# Patient Record
Sex: Male | Born: 1940
Health system: Southern US, Community
[De-identification: ages and names within clinical notes are randomized; demographics above are authoritative.]

## PROBLEM LIST (undated history)

## (undated) DIAGNOSIS — F32A Depression, unspecified: Secondary | ICD-10-CM

## (undated) DIAGNOSIS — F0390 Unspecified dementia without behavioral disturbance: Secondary | ICD-10-CM

## (undated) DIAGNOSIS — G20 Parkinson's disease: Secondary | ICD-10-CM

## (undated) DIAGNOSIS — F329 Major depressive disorder, single episode, unspecified: Secondary | ICD-10-CM

## (undated) HISTORY — DX: Major depressive disorder, single episode, unspecified: F32.9

## (undated) HISTORY — DX: Parkinson's disease: G20

## (undated) HISTORY — DX: Depression, unspecified: F32.A

---

## 2015-07-10 ENCOUNTER — Ambulatory Visit (INDEPENDENT_AMBULATORY_CARE_PROVIDER_SITE_OTHER): Payer: Medicare Other | Admitting: Neurology

## 2015-07-10 ENCOUNTER — Encounter: Payer: Self-pay | Admitting: Neurology

## 2015-07-10 VITALS — BP 120/60 | HR 64 | Ht 67.0 in | Wt 146.0 lb

## 2015-07-10 DIAGNOSIS — F0280 Dementia in other diseases classified elsewhere without behavioral disturbance: Secondary | ICD-10-CM | POA: Diagnosis not present

## 2015-07-10 DIAGNOSIS — G20 Parkinson's disease: Secondary | ICD-10-CM

## 2015-07-10 MED ORDER — CARBIDOPA-LEVODOPA ER 50-200 MG PO TBCR: 1.0000 | EXTENDED_RELEASE_TABLET | Freq: Every day | ORAL | Status: DC

## 2015-07-10 NOTE — Progress Notes (Signed)
William Cummings was seen today in the movement disorders clinic for neurologic consultation at the request of William Cummings, [name], NP.  The consultation is for the evaluation of Parkinsons disease.    This patient is accompanied in the office by his sister who supplements the history.  Pt states that he was dx 1 year ago in [place] but sx's started about a year prior to that.  First sx was tremor in the R hand and now tremor in the R foot.  No fam hx of PD.  Started on carbidopa/levodopa 25/100 about a year ago in [place] and it helped.  Moved to [place] last Aug, but hasn't had neurologist since moved here.  Currently takes carbidopa/levodopa 25/100 after breakfast after breakfast at 9:30am/after lunch at 2pm/after dinner at 6:45 and bedtime 11:00pm.  He can tell when the medication wears off.  His sister reports that he lives in independent assisted living and they have noticed that he has too many pills left at the end of the month.  At one point, they realized he was only taking one levodopa per day, but they do think he is taking more than that currently.  Specific Symptoms:  Tremor: Yes.   Voice: hypophonic Sleep: sleeps well  Vivid Dreams:  No.  Acting out dreams:  Yes.   (hollars out at night) Wet Pillows: No. Postural symptoms:  Yes.    Falls?  Yes.  , but last was 6 months ago Bradykinesia symptoms: slow movements and difficulty getting out of a chair Loss of smell:  Yes.   Loss of taste:  No. Urinary Incontinence:  No. Difficulty Swallowing:  No. Handwriting, micrographia: Yes.   Trouble with ADL's:  No.  Trouble buttoning clothing: Yes.  ; trouble tying shoes as well Depression:  Yes.   Memory changes:  No. Hallucinations:  No.  visual distortions: No. N/V:  No. Lightheaded:  No.  Syncope: No. Diplopia:  No. Dyskinesia:  No.  Doesn't know if he has ever had neuroimaging.  PREVIOUS MEDICATIONS: Sinemet  ALLERGIES:  No Known Allergies  CURRENT MEDICATIONS:  Outpatient Encounter  Prescriptions as of 07/10/2015  Medication Sig  . carbidopa-levodopa (SINEMET CR) 50-200 MG tablet Take 1 tablet by mouth at bedtime.  . carbidopa-levodopa (SINEMET IR) 25-100 MG tablet Take 1 tablet by mouth 4 (four) times daily.  . sertraline (ZOLOFT) 100 MG tablet Take 200 mg by mouth daily.   No facility-administered encounter medications on file as of 07/10/2015.    PAST MEDICAL HISTORY:   Past Medical History  Diagnosis Date  . Parkinson's disease (HCC)   . Depression     PAST SURGICAL HISTORY:  No past surgical history on file.  SOCIAL HISTORY:   Social History   Social History  . Marital Status: Single    Spouse Name: N/A  . Number of Children: N/A  . Years of Education: N/A   Occupational History  . Retired     Administrator, [employer]   Social History Main Topics  . Smoking status: Never Smoker   . Smokeless tobacco: Not on file  . Alcohol Use: 0.0 oz/week    0 Standard drinks or equivalent per week     Comment: glass of wine once every couple months  . Drug Use: No  . Sexual Activity: Not on file   Other Topics Concern  . Not on file   Social History Narrative  . No narrative on file    FAMILY HISTORY:   Family Status  Relation  Status Death Age  . Mother Deceased     HTN  . Father Deceased     HTN  . Brother Alive     x 6  . Sister Deceased     x1    ROS:  A complete 10 system review of systems was obtained and was unremarkable apart from what is mentioned above.  PHYSICAL EXAMINATION:    VITALS:   Filed Vitals:   07/10/15 1514  BP: 120/60  Pulse: 64  Height:  (1.702 m)  Weight: 146 lb (66.225 kg)    GEN:  The patient appears stated age and is in NAD. HEENT:  Normocephalic, atraumatic.  The mucous membranes are moist. The superficial temporal arteries are without ropiness or tenderness. CV:  RRR Lungs:  CTAB Neck/HEME:  There are no carotid bruits bilaterally.  Neurological examination:  Orientation:  Montreal Cognitive Assessment   07/10/2015  Visuospatial/ Executive (0/5) 5  Naming (0/3) 3  Attention: Read list of digits (0/2) 2  Attention: Read list of letters (0/1) 1  Attention: Serial 7 subtraction starting at 100 (0/3) 3  Language: Repeat phrase (0/2) 2  Language : Fluency (0/1) 1  Abstraction (0/2) 2  Delayed Recall (0/5) 0  Orientation (0/6) 4  Total 23  Adjusted Score (based on education) 23   Cranial nerves: There is good facial symmetry. There is facial hypomimia.  Pupils are pinpoint and minimally reactive.  Funduscopic exam is attempted, but disc margins are not well visualized bilaterally.  Extraocular muscles are intact. The visual fields are full to confrontational testing. The speech is fluent and clear but hypophonic. Soft palate rises symmetrically and there is no tongue deviation. Hearing is intact to conversational tone. Sensation: Sensation is intact to light and pinprick throughout (facial, trunk, extremities). Vibration is decreased at the bilateral big toe. There is no extinction with double simultaneous stimulation. There is no sensory dermatomal level identified. Motor: Strength is 5/5 in the bilateral upper and lower extremities.   Shoulder shrug is equal and symmetric.  There is a right pronator drift Deep tendon reflexes: Deep tendon reflexes are 2-/4 at the bilateral biceps, triceps, brachioradialis, patella and 1/4 at the bilateral achilles. Plantar responses are downgoing bilaterally.  Movement examination: Tone: There is mild increased tone in the right upper and left lower extremity.  Tone elsewhere is normal. Abnormal movements: There is RUE and RLE resting tremor.   Coordination:  There is  decremation with RAM's, with any form of RAMS, including alternating supination and pronation of the forearm, hand opening and closing, finger taps, heel taps and toe taps. Gait and Station: The patient has difficulty arising out of a deep-seated chair without the use of the hands and despite  multiple attempts, is unable to do so.  He ends up pushing off of the chair, but once he gets up, he is able to walk with a straight back and slightly decreased arm swing.  He is mildly ataxic.  Stride length is just slightly decreased.  He has a negative pull test.  ASSESSMENT/PLAN:  1.  Idiopathic Parkinson's disease.  The patient has tremor, bradykinesia, rigidity and mild postural instability.  -We discussed the diagnosis as well as pathophysiology of the disease.  We discussed treatment options as well as prognostic indicators.  Patient education was provided.  -Greater than 50% of the 60 minute visit was spent in counseling answering questions and talking about what to expect now as well as in the future.  We talked  about medication options as well as potential future surgical options.  We talked about safety in the home.  -Change the timing of his levodopa so that he is taking 1 tablet at 7 AM/11 AM/3 PM/7 PM and we will add carbidopa/levodopa 50/200 at night.  I encouraged his sister to try and find a pharmacy that will filled a pillbox for him.  I think that this will be easier.  -I will sent home physical and speech therapy to the house.  The patient is somewhat concerned about cost and apparently his son takes care of his finances.  His son lives in [place] and I told him to ask the therapy company about cost when they call him.  -We discussed community resources in the area including patient support groups and community exercise programs for PD and pt education was provided to the patient. 2.  Memory loss  -Even though the patient does well on his MoCA, I suspect that he does have mild Parkinson's dementia and that he does well in testing because he is highly educated.  I am concerned that he will not be able to live alone in independent living without much longer.  I think he may need assistance with medications before long.  We may need to explore neuropsych testing, but I did not want to  address that on his very first visit with me, when I addressed multiple other issues. 3.  I will plan on seeing him back in the next 2-3 months, sooner should new neurologic issues arise.

## 2015-07-10 NOTE — Patient Instructions (Signed)
1. Change Carbidopa Levodopa 25/100 IR dosage to the following: 1 tablet at 7:00 am, 1 tablet at 11:00 am, 1 tablet at 3:00 pm, 1 tablet at 7:00 pm  2. Add Carbidopa Levodopa 50/200 CR 1 tablet at bedtime. RX sent to your pharmacy.   3. We have referred [facility] for [facility]. They will contact you directly to set up care. They can be reached at [phone].  4. You can use a stationary bike for exercise.   5. Follow up 3 months.

## 2015-07-11 ENCOUNTER — Telehealth: Payer: Self-pay | Admitting: Neurology

## 2015-07-11 NOTE — Telephone Encounter (Signed)
Referral to [facility] for [facility] PT/ST faxed to [phone] with confirmation received. They will contact patient.  

## 2015-07-31 ENCOUNTER — Telehealth: Payer: Self-pay | Admitting: Neurology

## 2015-07-31 NOTE — Telephone Encounter (Signed)
Received voicemail from SheridanBetsy at [facility] [phone] asking for PT for 2 weeks and order for OT eval. Called back and gave verbal okay. She is to call with any other questions.

## 2015-10-10 ENCOUNTER — Ambulatory Visit: Payer: Medicare Other | Admitting: Neurology

## 2015-10-13 ENCOUNTER — Ambulatory Visit: Payer: Medicare Other | Admitting: Neurology

## 2015-11-04 ENCOUNTER — Other Ambulatory Visit: Payer: Self-pay | Admitting: Neurology

## 2015-11-04 MED ORDER — CARBIDOPA-LEVODOPA ER 50-200 MG PO TBCR: 1.0000 | EXTENDED_RELEASE_TABLET | Freq: Every day | ORAL | Status: DC

## 2015-11-04 NOTE — Telephone Encounter (Signed)
Carbidopa Levodopa 50/200 refill requested. Per last office note- patient to remain on medication. Refill approved and sent to patient's pharmacy.   

## 2015-11-05 ENCOUNTER — Ambulatory Visit: Payer: Medicare Other | Admitting: Neurology

## 2015-12-08 NOTE — Progress Notes (Signed)
William Cummings was seen today in the movement disorders clinic for neurologic consultation at the request of Adria Dill, NP.  The consultation is for the evaluation of Parkinsons disease.    This patient is accompanied in the office by his sister who supplements the history.  Pt states that he was dx 1 year ago in [place] but sx's started about a year prior to that.  First sx was tremor in the R hand and now tremor in the R foot.  No fam hx of PD.  Started on carbidopa/levodopa 25/100 about a year ago in [place] and it helped.  Moved to [place] last Aug, but hasn't had neurologist since moved here.  Currently takes carbidopa/levodopa 25/100 after breakfast after breakfast at 9:30am/after lunch at 2pm/after dinner at 6:45 and bedtime 11:00pm.  He can tell when the medication wears off.  His sister reports that he lives in independent assisted living and they have noticed that he has too many pills left at the end of the month.  At one point, they realized he was only taking one levodopa per day, but they do think he is taking more than that currently.  12/09/15 update:  The patient follows up today.  I have not seen him since his March visit, as he canceled his June visit at the same day as the visit.  He is supposed to be on carbidopa/levodopa 25/100, one tablet at 7 AM/11 AM/3 PM/7 PM and last visit I added carbidopa/levodopa 50/200 at bedtime; however he states that when he added the bedtime CR he dropped one tablet of the IR so he is only taking three of those throughout the day.   Sister came in part way through the visit and states that he just couldn't remember to take it four times a day.   Lives at [facility] but he cannot afford for them to do meds.  They do meals for him.    I also referred him for home physical therapies.   He cannot remember how long he did this.  He doesn't drive.     No cramping of the feet and toes at night.    Wearing off:  Yes.    How long before next dose:  n/a Falls:    No. N/V:  No. Hallucinations:  No.  visual distortions: No. Lightheaded:  No.  Syncope: No. Dyskinesia:  No.    PREVIOUS MEDICATIONS: Sinemet  ALLERGIES:  No Known Allergies  CURRENT MEDICATIONS:  Outpatient Encounter Prescriptions as of 12/09/2015  Medication Sig  . carbidopa-levodopa (SINEMET CR) 50-200 MG tablet Take 1 tablet by mouth at bedtime.  . carbidopa-levodopa (SINEMET IR) 25-100 MG tablet 2 in AM, 1 in afternoon and evening  . sertraline (ZOLOFT) 100 MG tablet Take 200 mg by mouth daily.  . [DISCONTINUED] carbidopa-levodopa (SINEMET CR) 50-200 MG tablet Take 1 tablet by mouth at bedtime.  . [DISCONTINUED] carbidopa-levodopa (SINEMET IR) 25-100 MG tablet Take 1 tablet by mouth 3 (three) times daily.    No facility-administered encounter medications on file as of 12/09/2015.     PAST MEDICAL HISTORY:   Past Medical History:  Diagnosis Date  . Depression   . Parkinson's disease (HCC)     PAST SURGICAL HISTORY:  No past surgical history on file.  SOCIAL HISTORY:   Social History   Social History  . Marital status: Single    Spouse name: N/A  . Number of children: N/A  . Years of education: N/A  Occupational History  . Retired     Administrator, [employer]   Social History Main Topics  . Smoking status: Never Smoker  . Smokeless tobacco: Not on file  . Alcohol use 0.0 oz/week     Comment: glass of wine once every couple months  . Drug use: No  . Sexual activity: Not on file   Other Topics Concern  . Not on file   Social History Narrative  . No narrative on file    FAMILY HISTORY:   Family Status  Relation Status  . Mother Deceased   HTN  . Father Deceased   HTN  . Brother Alive   x 6  . Sister Deceased   x1    ROS:  A complete 10 system review of systems was obtained and was unremarkable apart from what is mentioned above.  PHYSICAL EXAMINATION:    VITALS:   Vitals:   12/09/15 1458  BP: 126/68  Pulse: 82  Weight: 149 lb (67.6 kg)  Height: 5\' 7"   (1.702 m)    GEN:  The patient appears stated age and is in NAD. HEENT:  Normocephalic, atraumatic.  The mucous membranes are moist. The superficial temporal arteries are without ropiness or tenderness. CV:  RRR Lungs:  CTAB Neck/HEME:  There are no carotid bruits bilaterally.  Neurological examination:  Orientation:  Montreal Cognitive Assessment  07/10/2015  Visuospatial/ Executive (0/5) 5  Naming (0/3) 3  Attention: Read list of digits (0/2) 2  Attention: Read list of letters (0/1) 1  Attention: Serial 7 subtraction starting at 100 (0/3) 3  Language: Repeat phrase (0/2) 2  Language : Fluency (0/1) 1  Abstraction (0/2) 2  Delayed Recall (0/5) 0  Orientation (0/6) 4  Total 23  Adjusted Score (based on education) 23   Cranial nerves: There is good facial symmetry. There is facial hypomimia.  Pupils are pinpoint and minimally reactive.  Funduscopic exam is attempted, but disc margins are not well visualized bilaterally.  Extraocular muscles are intact. The visual fields are full to confrontational testing. The speech is fluent and clear but hypophonic. Soft palate rises symmetrically and there is no tongue deviation. Hearing is intact to conversational tone. Sensation: Sensation is intact to light touch throughout Motor: Strength is 5/5 in the bilateral upper and lower extremities.   Shoulder shrug is equal and symmetric.  There is a right pronator drift   Movement examination: Tone: There is mild increased tone in the right upper and left lower extremity.  Tone elsewhere is normal. Abnormal movements: There is RUE and RLE resting tremor that is moderate in nature but intermittent.   Coordination:  There is  decremation with RAM's, with any form of RAMS, including alternating supination and pronation of the forearm, hand opening and closing, finger taps, heel taps and toe taps. Gait and Station: The patient has difficulty arising out of a deep-seated chair without the use of the  hands and despite multiple attempts, is unable to do so.  He ends up pushing off of the chair, but once he gets up, he is able to walk with a straight back and slightly decreased arm swing.  He is mildly ataxic.  Stride length is just slightly decreased.  He has a negative pull test.  ASSESSMENT/PLAN:  1.  Idiopathic Parkinson's disease.  The patient has tremor, bradykinesia, rigidity and mild postural instability.  -Cannot take the daytime dose of levodopa more than 3 times a day, primarily because he cannot remember it and his sister  has to call him and remind him to take medication.  [facility], but cannot afford the medication distribution part.  Therefore, we opted to have him take carbidopa/levodopa 25/100, 2 tablets in the morning, one in the afternoon, one in the evening and then continue his carbidopa/levodopa 50/200 at night.  -I encouraged the patient to [facility] stationary [facility].  I talked to the patient and his sister about the rock steady boxing program, if he is able to get there.  He does not drive.  -We discussed community resources in the area including patient support groups and community exercise programs for PD and pt education was provided to the patient. 2.  Parkinson's dementia  -Even though the patient does well on his MoCA, I suspect that he does have Parkinson's dementia and that he does well in testing because he is highly educated.  I am concerned that he will not be able to live alone in independent living without much longer.  However, cost is a big issue for them, and they do seem to be managing okay right the second.  We talked about ways to manage medication.  They cannot afford a pillbox with an alarm on it, but his sister is starting to call him more faithfully and we did rework his medications to help that today. 3.  I will plan on seeing him back in the next 4-5 months, sooner should new neurologic issues arise.  Much greater  than 50% of this visit was spent in counseling discussing issues of home safety.  Total face to face time:  30 min

## 2015-12-09 ENCOUNTER — Encounter: Payer: Self-pay | Admitting: Neurology

## 2015-12-09 ENCOUNTER — Ambulatory Visit (INDEPENDENT_AMBULATORY_CARE_PROVIDER_SITE_OTHER): Payer: Medicare Other | Admitting: Neurology

## 2015-12-09 VITALS — BP 126/68 | HR 82 | Ht 67.0 in | Wt 149.0 lb

## 2015-12-09 DIAGNOSIS — G20 Parkinson's disease: Secondary | ICD-10-CM | POA: Diagnosis not present

## 2015-12-09 DIAGNOSIS — G20A1 Parkinson's disease without dyskinesia, without mention of fluctuations: Secondary | ICD-10-CM

## 2015-12-09 DIAGNOSIS — F0280 Dementia in other diseases classified elsewhere without behavioral disturbance: Secondary | ICD-10-CM | POA: Diagnosis not present

## 2015-12-09 MED ORDER — CARBIDOPA-LEVODOPA ER 50-200 MG PO TBCR: 1.0000 | EXTENDED_RELEASE_TABLET | Freq: Every day | ORAL | 5 refills | Status: DC

## 2015-12-09 MED ORDER — CARBIDOPA-LEVODOPA 25-100 MG PO TABS: ORAL_TABLET | ORAL | 5 refills | Status: DC

## 2015-12-09 NOTE — Patient Instructions (Addendum)
1.  Start taking 2 tablets of carbidopa/levodopa 25/100 in the AM at 7am, and continue taking 1 tablet about 11-12 and 1 tablet around 4-5 pm 2.  Continue taking carbidopa/levodopa 50/200 at bedtime 3.  Ride the stationary bike

## 2016-05-11 ENCOUNTER — Ambulatory Visit: Payer: Medicare Other | Admitting: Neurology

## 2016-05-31 ENCOUNTER — Telehealth: Payer: Self-pay | Admitting: Neurology

## 2016-06-05 ENCOUNTER — Other Ambulatory Visit: Payer: Self-pay | Admitting: Neurology

## 2016-06-16 NOTE — Telephone Encounter (Signed)
Made in error

## 2016-06-16 NOTE — Progress Notes (Signed)
William Cummings was seen today in the movement disorders clinic for neurologic consultation at the request of [name], [name], NP.  The consultation is for the evaluation of Parkinsons disease.    This patient is accompanied in the office by his sister who supplements the history.  Pt states that he was dx 1 year ago in [place] but sx's started about a year prior to that.  First sx was tremor in the R hand and now tremor in the R foot.  No fam hx of PD.  Started on carbidopa/levodopa 25/100 about a year ago in [place] and it helped.  Moved to [place] last Aug, but hasn't had neurologist since moved here.  Currently takes carbidopa/levodopa 25/100 after breakfast after breakfast at 9:30am/after lunch at 2pm/after dinner at 6:45 and bedtime 11:00pm.  He can tell when the medication wears off.  His sister reports that he lives in independent assisted living and they have noticed that he has too many pills left at the end of the month.  At one point, they realized he was only taking one levodopa per day, but they do think he is taking more than that currently.  12/09/15 update:  The patient follows up today.  I have not seen him since his March visit, as he canceled his June visit at the same day as the visit.  He is supposed to be on carbidopa/levodopa 25/100, one tablet at 7 AM/11 AM/3 PM/7 PM and last visit I added carbidopa/levodopa 50/200 at bedtime; however he states that when he added the bedtime CR he dropped one tablet of the IR so he is only taking three of those throughout the day.   Sister came in part way through the visit and states that he just couldn't remember to take it four times a day.   Lives at [facility] but he cannot afford for them to do meds.  They do meals for him.    I also referred him for home physical therapies.   He cannot remember how long he did this.  He doesn't drive.     No cramping of the feet and toes at night.    Wearing off:  Yes.    How long before next dose:  n/a Falls:    No. N/V:  No. Hallucinations:  No.  visual distortions: No. Lightheaded:  No.  Syncope: No. Dyskinesia:  No.   06/17/16 update:  Patient follows up today.  His sister is apparently here but she doesn't come back to the room today.   I have not seen him in about 6 months.  He is prescribed carbidopa/levodopa 25/100, 2 tablets in the morning, one in the afternoon and one in the evening in addition to carbidopa/levodopa 50/200 at bedtime.  He states that he is doing pretty well.  No falls.  Not exercising.  Memory has been fair - "I still remember where my room and the dining room is."  No visual distortions or hallucinations.  No lightheadedness.    PREVIOUS MEDICATIONS: Sinemet  ALLERGIES:  No Known Allergies  CURRENT MEDICATIONS:  Outpatient Encounter Prescriptions as of 06/17/2016  Medication Sig  . carbidopa-levodopa (SINEMET CR) 50-200 MG tablet take 1 tablet by mouth at bedtime  . carbidopa-levodopa (SINEMET IR) 25-100 MG tablet 2 in AM, 1 in afternoon and evening  . sertraline (ZOLOFT) 100 MG tablet Take 200 mg by mouth daily.   No facility-administered encounter medications on file as of 06/17/2016.     PAST MEDICAL  HISTORY:   Past Medical History:  Diagnosis Date  . Depression   . Parkinson's disease (HCC)     PAST SURGICAL HISTORY:  History reviewed. No pertinent surgical history.  SOCIAL HISTORY:   Social History   Social History  . Marital status: Single    Spouse name: N/A  . Number of children: N/A  . Years of education: N/A   Occupational History  . Retired     Administrator, arts.   Social History Main Topics  . Smoking status: Never Smoker  . Smokeless tobacco: Never Used  . Alcohol use 0.0 oz/week     Comment: glass of wine once every couple months  . Drug use: No  . Sexual activity: Not on file   Other Topics Concern  . Not on file   Social History Narrative  . No narrative on file    FAMILY HISTORY:   Family Status  Relation Status  . Mother Deceased    HTN  . Father Deceased   HTN  . Brother Alive   x 6  . Sister Deceased   x1    ROS:  A complete 10 system review of systems was obtained and was unremarkable apart from what is mentioned above.  PHYSICAL EXAMINATION:    VITALS:   Vitals:   06/17/16 1551  BP: 110/80  Pulse: 86  SpO2: 96%  Weight: 154 lb 6 oz (70 kg)  Height: 5\' 7"  (1.702 m)    GEN:  The patient appears stated age and is in NAD. HEENT:  Normocephalic, atraumatic.  The mucous membranes are moist. The superficial temporal arteries are without ropiness or tenderness. CV:  RRR Lungs:  CTAB Neck/HEME:  There are no carotid bruits bilaterally.  Neurological examination:  Orientation:  Montreal Cognitive Assessment  06/17/2016 07/10/2015  Visuospatial/ Executive (0/5) 4 5  Naming (0/3) 3 3  Attention: Read list of digits (0/2) 2 2  Attention: Read list of letters (0/1) 1 1  Attention: Serial 7 subtraction starting at 100 (0/3) 3 3  Language: Repeat phrase (0/2) 2 2  Language : Fluency (0/1) 1 1  Abstraction (0/2) 2 2  Delayed Recall (0/5) 0 0  Orientation (0/6) 3 4  Total 21 23  Adjusted Score (based on education) - 23   Cranial nerves: There is good facial symmetry. There is facial hypomimia.  Extraocular muscles are intact. The visual fields are full to confrontational testing. The speech is fluent and clear but hypophonic. Soft palate rises symmetrically and there is no tongue deviation. Hearing is intact to conversational tone. Sensation: Sensation is intact to light touch throughout Motor: Strength is 5/5 in the bilateral upper and lower extremities.   Shoulder shrug is equal and symmetric.  There is a right pronator drift   Movement examination: Tone: There is mild increased tone in the right upper extremity.  Tone elsewhere is normal. Abnormal movements: There is RUE and RLE resting tremor that is mild to moderate in nature but intermittent.   Coordination:  There is  Minimal decremation today.    Gait and Station: The patient easily arises out of the chair.  He is able to walk with a straight back and good but purposeful arm swing.    ASSESSMENT/PLAN:  1.  Idiopathic Parkinson's disease.  The patient has tremor, bradykinesia, rigidity and mild postural instability.  -Cannot take the daytime dose of levodopa more than 3 times a day, primarily because he cannot remember it and his sister has to call him  and remind him to take medication.  He does live at [facility], but cannot afford the medication distribution part.  At this point, he is doing well with carbidopa/levodopa 25/100, 2 tablets in the morning, one in the afternoon, one in the evening and then continue his carbidopa/levodopa 50/200 at night.  -I encouraged the patient to use his stationary bike at [facility].   -We discussed community resources in the area including patient support groups and community exercise programs for PD and pt education was provided to the patient. 2.  Parkinson's dementia  -Even though the patient does well on his MoCA, I suspect that he does have Parkinson's dementia and that he does well in testing because he is highly educated.  I am concerned that he will not be able to live alone in independent living without much longer.  However, cost is a big issue for them, and they do seem to be managing okay right the second.  We talked about ways to manage medication.  They cannot afford a pillbox with an alarm on it, but his sister is starting to call him more faithfully 3.  I will plan on seeing him back in the next 4-5 months, sooner should new neurologic issues arise.  Much greater than 50% of this visit was spent in counseling discussing issues of home safety.  Total face to face time:  25 min

## 2016-06-17 ENCOUNTER — Ambulatory Visit (INDEPENDENT_AMBULATORY_CARE_PROVIDER_SITE_OTHER): Payer: Medicare HMO | Admitting: Neurology

## 2016-06-17 ENCOUNTER — Encounter: Payer: Self-pay | Admitting: Neurology

## 2016-06-17 VITALS — BP 110/80 | HR 86 | Ht 67.0 in | Wt 154.4 lb

## 2016-06-17 DIAGNOSIS — G20 Parkinson's disease: Secondary | ICD-10-CM

## 2016-06-17 DIAGNOSIS — G20A1 Parkinson's disease without dyskinesia, without mention of fluctuations: Secondary | ICD-10-CM

## 2016-06-17 DIAGNOSIS — F0280 Dementia in other diseases classified elsewhere without behavioral disturbance: Secondary | ICD-10-CM

## 2016-06-17 NOTE — Patient Instructions (Signed)
Try to get in some physical and mental exercises.  You look well today.   Call me when you need medication.  I will see you in 5 months.  Call me if you need me sooner.

## 2016-07-09 ENCOUNTER — Other Ambulatory Visit: Payer: Self-pay | Admitting: Neurology

## 2016-08-06 ENCOUNTER — Telehealth: Payer: Self-pay | Admitting: Neurology

## 2016-08-06 NOTE — Telephone Encounter (Signed)
Received a call from Virgel Gess (sister) regarding medication: Carbidopa Levodopa 50 200 MG.  Patient needs a refill of medication. Yes  Patient having side effects from medication. No  Patient calling to update [place] on medication. Patient's sister [name] called regarding her brother's medication. She said he has enough through next Friday but would like a refill called in for him next week. I made her aware you were not in the office this afternoon. Thanks

## 2016-08-09 ENCOUNTER — Other Ambulatory Visit: Payer: Self-pay | Admitting: Neurology

## 2016-08-09 MED ORDER — CARBIDOPA-LEVODOPA ER 50-200 MG PO TBCR: 1.0000 | EXTENDED_RELEASE_TABLET | Freq: Every day | ORAL | 5 refills | Status: DC

## 2016-08-09 NOTE — Telephone Encounter (Signed)
RX sent to pharmacy  

## 2016-11-30 NOTE — Progress Notes (Deleted)
William Cummings was seen today in the movement disorders clinic for neurologic consultation at the request of William Cummings, William Cummings.  The consultation is for the evaluation of Parkinsons disease.    This patient is accompanied in the office by his sister who supplements the history.  Pt states that he was dx 1 year ago in [place] but sx's started about a year prior to that.  First sx was tremor in the R hand and now tremor in the R foot.  No fam hx of PD.  Started on carbidopa/levodopa 25/100 about a year ago in [place] and it helped.  Moved to [place] last Aug, but hasn't had neurologist since moved here.  Currently takes carbidopa/levodopa 25/100 after breakfast after breakfast at 9:30am/after lunch at 2pm/after dinner at 6:45 and bedtime 11:00pm.  He can tell when the medication wears off.  His sister reports that he lives in independent assisted living and they have noticed that he has too many pills left at the end of the month.  At one point, they realized he was only taking one levodopa per day, but they do think he is taking more than that currently.  12/09/15 update:  The patient follows up today.  I have not seen him since his March visit, as he canceled his June visit at the same day as the visit.  He is supposed to be on carbidopa/levodopa 25/100, one tablet at 7 AM/11 AM/3 PM/7 PM and last visit I added carbidopa/levodopa 50/200 at bedtime; however he states that when he added the bedtime CR he dropped one tablet of the IR so he is only taking three of those throughout the day.   Sister came in part way through the visit and states that he just couldn't remember to take it four times a day.   Lives at [facility] but he cannot afford for them to do meds.  They do meals for him.    I also referred him for home physical therapies.   He cannot remember how long he did this.  He doesn't drive.     No cramping of the feet and toes at night.    Wearing off:  Yes.    How long before next dose:  n/a Falls:    No. N/V:  No. Hallucinations:  No.  visual distortions: No. Lightheaded:  No.  Syncope: No. Dyskinesia:  No.   06/17/16 update:  Patient follows up today.  His sister is apparently here but she doesn't come back to the room today.   I have not seen him in about 6 months.  He is prescribed carbidopa/levodopa 25/100, 2 tablets in the morning, one in the afternoon and one in the evening in addition to carbidopa/levodopa 50/200 at bedtime.  He states that he is doing pretty well.  No falls.  Not exercising.  Memory has been fair - "I still remember where my room and the dining room is."  No visual distortions or hallucinations.  No lightheadedness.  12/02/16 update:  Pt seen in f/u.  On carbidopa/levodopa 25/100, 2/1/2 and carbidopa/levodopa 50/200 at bed.  Pt denies falls.  Pt denies lightheadedness, near syncope.  No hallucinations.  Mood has been good.    PREVIOUS MEDICATIONS: Sinemet  ALLERGIES:  No Known Allergies  CURRENT MEDICATIONS:  Outpatient Encounter Prescriptions as of 12/02/2016  Medication Sig  . carbidopa-levodopa (SINEMET CR) 50-200 MG tablet Take 1 tablet by mouth at bedtime.  . carbidopa-levodopa (SINEMET CR) 50-200 MG tablet take 1  tablet by mouth at bedtime  . carbidopa-levodopa (SINEMET IR) 25-100 MG tablet take 2 tablets by mouth every morning and take 1 tablet IN THE AFTERNOON AND THE EVENING  . sertraline (ZOLOFT) 100 MG tablet Take 200 mg by mouth daily.   No facility-administered encounter medications on file as of 12/02/2016.     PAST MEDICAL HISTORY:   Past Medical History:  Diagnosis Date  . Depression   . Parkinson's disease (HCC)     PAST SURGICAL HISTORY:  No past surgical history on file.  SOCIAL HISTORY:   Social History   Social History  . Marital status: Single    Spouse name: N/A  . Number of children: N/A  . Years of education: N/A   Occupational History  . Retired     Administrator, arts.   Social History Main Topics  . Smoking status: Never Smoker  .  Smokeless tobacco: Never Used  . Alcohol use 0.0 oz/week     Comment: glass of wine once every couple months  . Drug use: No  . Sexual activity: Not on file   Other Topics Concern  . Not on file   Social History Narrative  . No narrative on file    FAMILY HISTORY:   Family Status  Relation Status  . Mother Deceased       HTN  . Father Deceased       HTN  . Brother Alive       x 6  . Sister Deceased       x1    ROS:  A complete 10 system review of systems was obtained and was unremarkable apart from what is mentioned above.  PHYSICAL EXAMINATION:    VITALS:   There were no vitals filed for this visit.  GEN:  The patient appears stated age and is in NAD. HEENT:  Normocephalic, atraumatic.  The mucous membranes are moist. The superficial temporal arteries are without ropiness or tenderness. CV:  RRR Lungs:  CTAB Neck/HEME:  There are no carotid bruits bilaterally.  Neurological examination:  Orientation:  Montreal Cognitive Assessment  06/17/2016 07/10/2015  Visuospatial/ Executive (0/5) 4 5  Naming (0/3) 3 3  Attention: Read list of digits (0/2) 2 2  Attention: Read list of letters (0/1) 1 1  Attention: Serial 7 subtraction starting at 100 (0/3) 3 3  Language: Repeat phrase (0/2) 2 2  Language : Fluency (0/1) 1 1  Abstraction (0/2) 2 2  Delayed Recall (0/5) 0 0  Orientation (0/6) 3 4  Total 21 23  Adjusted Score (based on education) - 23   Cranial nerves: There is good facial symmetry. There is facial hypomimia.  Extraocular muscles are intact. The visual fields are full to confrontational testing. The speech is fluent and clear but hypophonic. Soft palate rises symmetrically and there is no tongue deviation. Hearing is intact to conversational tone. Sensation: Sensation is intact to light touch throughout Motor: Strength is 5/5 in the bilateral upper and lower extremities.   Shoulder shrug is equal and symmetric.  There is a right pronator drift   Movement  examination: Tone: There is mild increased tone in the right upper extremity.  Tone elsewhere is normal. Abnormal movements: There is RUE and RLE resting tremor that is mild to moderate in nature but intermittent.   Coordination:  There is  Minimal decremation today.   Gait and Station: The patient easily arises out of the chair.  He is able to walk with a straight back  and good but purposeful arm swing.    ASSESSMENT/PLAN:  1.  Idiopathic Parkinson's disease.  The patient has tremor, bradykinesia, rigidity and mild postural instability.  -Cannot take the daytime dose of levodopa more than 3 times a day, primarily because he cannot remember it and his sister has to call him and remind him to take medication.  He does live at [facility], but cannot afford the medication distribution part.  At this point, he is doing well with carbidopa/levodopa 25/100, 2 tablets in the morning, one in the afternoon, one in the evening and then continue his carbidopa/levodopa 50/200 at night.  -I encouraged the patient to use his stationary bike at [facility].   -We discussed community resources in the area including patient support groups and community exercise programs for PD and pt education was provided to the patient. 2.  Parkinson's dementia  -Even though the patient does well on his MoCA, I suspect that he does have Parkinson's dementia and that he does well in testing because he is highly educated.  I am concerned that he will not be able to live alone in independent living without much longer.  However, cost is a big issue for them, and they do seem to be managing okay right the second.  We talked about ways to manage medication.  They cannot afford a pillbox with an alarm on it, but his sister is starting to call him more faithfully 3.  I will plan on seeing him back in the next 4-5 months, sooner should new neurologic issues arise.  Much greater than 50% of this visit was spent in counseling  discussing issues of home safety.  Total face to face time:  25 min

## 2016-12-02 ENCOUNTER — Ambulatory Visit: Payer: Medicare Other | Admitting: Neurology

## 2016-12-02 DIAGNOSIS — Z029 Encounter for administrative examinations, unspecified: Secondary | ICD-10-CM

## 2016-12-06 ENCOUNTER — Telehealth: Payer: Self-pay | Admitting: Neurology

## 2016-12-06 ENCOUNTER — Encounter: Payer: Self-pay | Admitting: Neurology

## 2016-12-06 NOTE — Telephone Encounter (Signed)
PT needs a refill of carbadopa called in, has rescheduled his appointment for November

## 2016-12-06 NOTE — Telephone Encounter (Signed)
He has dementia but I believe sister is supposed to bring him.  Tell them we will fill until November only but won't be able to fill if n/s that appt

## 2016-12-06 NOTE — Telephone Encounter (Signed)
Patient has no showed twice. Has appt in November. Last seen in February. Please advise if okay to refill.

## 2016-12-06 NOTE — Telephone Encounter (Signed)
Called and spoke with wife who states they still have three refills on prescriptions. They will call if needed and keep follow up appt.

## 2017-02-27 ENCOUNTER — Other Ambulatory Visit: Payer: Self-pay | Admitting: Neurology

## 2017-03-11 ENCOUNTER — Ambulatory Visit: Payer: Medicare Other | Admitting: Neurology

## 2017-04-29 ENCOUNTER — Other Ambulatory Visit: Payer: Self-pay | Admitting: Neurology

## 2017-05-18 ENCOUNTER — Ambulatory Visit: Payer: Medicare Other | Admitting: Family Medicine

## 2017-05-22 ENCOUNTER — Other Ambulatory Visit: Payer: Self-pay | Admitting: Neurology

## 2017-05-23 ENCOUNTER — Telehealth: Payer: Self-pay | Admitting: Neurology

## 2017-05-23 NOTE — Telephone Encounter (Signed)
Lucendia HerrlichFaye left a voicemail message saying pt was out of medication and his appointment is in February and wants a refill, did not say the name of the medication

## 2017-05-23 NOTE — Telephone Encounter (Signed)
LMOM for William Cummings to call back.  To let her know she will have to contact PCP about refilling medication. They were told to keep appt in November for refills and that appt wasn't kept. He has had several no shows.

## 2017-05-23 NOTE — Telephone Encounter (Signed)
Last note states if patient n/s appt in November no more refills.

## 2017-05-23 NOTE — Telephone Encounter (Signed)
William Cummings called back and I made her aware.

## 2017-05-23 NOTE — Telephone Encounter (Signed)
William Cummings needs to talk to someone about patient medication he does not have enough to last him until feb and she wants to know what to do

## 2017-06-02 ENCOUNTER — Ambulatory Visit (INDEPENDENT_AMBULATORY_CARE_PROVIDER_SITE_OTHER): Payer: Medicare HMO | Admitting: Family Medicine

## 2017-06-02 ENCOUNTER — Encounter: Payer: Self-pay | Admitting: Family Medicine

## 2017-06-02 VITALS — BP 110/80 | HR 93 | Temp 97.8°F | Ht 66.5 in | Wt 150.5 lb

## 2017-06-02 DIAGNOSIS — H6123 Impacted cerumen, bilateral: Secondary | ICD-10-CM | POA: Diagnosis not present

## 2017-06-02 DIAGNOSIS — G20 Parkinson's disease: Secondary | ICD-10-CM | POA: Diagnosis not present

## 2017-06-02 DIAGNOSIS — Z7689 Persons encountering health services in other specified circumstances: Secondary | ICD-10-CM | POA: Diagnosis not present

## 2017-06-02 DIAGNOSIS — F339 Major depressive disorder, recurrent, unspecified: Secondary | ICD-10-CM | POA: Diagnosis not present

## 2017-06-02 NOTE — Patient Instructions (Addendum)
Parkinson Disease Parkinson disease is a long-term (chronic) condition that gets worse over time (is progressive). Parkinson disease limits your ability to control your movements and move your body normally. This condition is a type of movement disorder. Each person with Parkinson disease is affected differently. The condition can range from mild to severe. Parkinson disease tends to progress slowly over several years. What are the causes? Parkinson disease results from a loss of brain cells (neurons) in a specific part of the brain (substantia nigra). Some of the neurons in the substantia nigra make an important brain chemical (dopamine). Dopamine is needed to control movement. As the condition gets worse, neurons make less dopamine. This makes it hard to move or control your movements. The exact cause of why neurons are lost or produce less dopamine is not known. Genetic and environmental factors may contribute to the cause of Parkinson disease. What increases the risk? This condition is more likely to develop in:  Men.  People who are 60 years of age or older.  People who have a family history of Parkinson disease.  What are the signs or symptoms? Symptoms of this condition can vary from person to person. The main (primary) symptoms are related to movement (motor symptoms). These include:  Uncontrolled shaking movements (tremor). Tremors usually start in a hand or foot when you are resting (resting tremor). The tremor may stop when you move around.  Slowing of movement. You may lose facial expression and have trouble making the small movements that are needed to button clothing or brush your teeth. You may walk with short, shuffling steps.  Stiff movement (rigidity). This mostly affects your arms, legs, neck, and upper body. You may walk without swinging your arms. Rigidity can be painful.  Loss of balance and stability when standing. You may sway, fall backward, and have trouble making  turns.  Secondary motor symptoms of this condition include:  Shrinking handwriting.  Stooped posture.  Slowed speech.  Trouble swallowing.  Drooling.  Sexual dysfunction.  Muscle cramps.  Loss of smell.  Additional symptoms that are not related to movement include:  Constipation.  Mood swings.  Depression or anxiety.  Sleep disturbances.  Confusion.  Loss of mental abilities (dementia).  Low blood pressure.  Trouble concentrating.  How is this diagnosed? Parkinson disease can be hard to diagnose in its early stages. A diagnosis may be made based on symptoms, a medical history, and physical exam. During your exam, your health care provider will look for:  Lack of facial expression.  Resting tremor.  Stiffness in your neck, arms, and legs.  Abnormal walk.  Trouble with balance.  You may have brain imaging tests done to check for a loss of dopamine-producing areas of the brain. Your healthcare provider may also grade the severity of your condition as mild, moderate, or advanced. Parkinson disease progression is different for everyone. You may not progress to the advanced stage. Mild Parkinson disease involves:  Movement problems that do not affect daily activities.  Movement problems on one side of the body.  Movement problems that are controlled with medicines.  Good response to exercise.  Moderate Parkinson disease involves:  Movement problems on both sides of the body.  Slowing of movement.  Coordination and balance problems.  Less of a response to medicine.  More side effects from medicines.  Advanced Parkinson disease involves:  Extreme difficulty walking.  Inability to live alone safely.  Signs of dementia.  Difficulty controlling symptoms with medicine.  How is   this treated? There is no cure for Parkinson disease. Treatment focuses on relieving your symptoms. Treatment may include:  Medicines.  Speech, occupational, and  physical therapy.  Surgery.  Everyone responds to medicines differently. Your response may change over time. Work with your health care provider to find the best medicines for you. These may include:  Dopamine replacement drugs. These are the most effective medicines. A long-term side effect of these medicines is uncontrolled movements (dyskinesias).  Dopamine agonists. These drugs act like dopamine to stimulate dopamine receptors in the brain. Side effects include nausea and sleepiness, but they cause less dyskinesia.  Other medicines to reduce tremor, prevent dopamine breakdown, reduce dyskinesia, and reduce dementia that is related to Parkinson disease.  Another treatment is deep brain stimulation surgery to reduce tremors and dyskinesia. This procedure involves placing electrodes in the brain. The electrodes are attached to an electric pulse generator that acts like a pacemaker for your brain. This may be an option if you have had the condition for at least four years and are not responding well to medicines. Follow these instructions at home:  Take over-the-counter and prescription medicines only as told by your health care provider.  Install grab bars and railings in your home to prevent falls.  Follow instructions from your health care provider about eating or drinking restrictions.  Return to your normal activities as told by your health care provider. Ask your health care provider what activities are safe for you.  Get regular exercise as told by your health care provider or a physical therapist.  Keep all follow-up visits as told by your health care provider. This is important. These include any visits with a speech therapist or occupational therapist.  Consider joining a support group for people with Parkinson disease. Contact a health care provider if:  Medicines do not help your symptoms.  You are unsteady or have fallen at home.  You need more support to function well  at home.  You have trouble swallowing.  You have severe constipation.  You are struggling with side effects from your medicines.  You see or hear things that are not real (hallucinate).  You feel confused, anxious, or depressed. Get help right away if:  You are injured after a fall.  You cannot swallow without choking.  You have chest pain or trouble breathing.  You do not feel safe at home. This information is not intended to replace advice given to you by your health care provider. Make sure you discuss any questions you have with your health care provider. Document Released: 04/09/2000 Document Revised: 09/15/2015 Document Reviewed: 01/31/2015 Elsevier Interactive Patient Education  [employer]2018 [employer]  Supporting Someone With Depression Depression is a mental health condition that affects the way a person feels, thinks, and handles daily activities such as eating, sleeping, and working. When a person has depression, his or her condition can affect others around him or her, such as friends and family members. Friends and family can help by offering support and understanding. What do I need to know about this condition? The main symptoms of depression are:  Constant depressed or irritable mood.  Loss of interest in things and activities that were enjoyed in the past.  Other symptoms of depression include:  Fatigue.  Sleeping too much or too little.  Difficulty falling asleep, or waking up early and not being able to get back to sleep.  Difficulty concentrating or making decisions.  Changes in appetite and weight.  Staying away from  others (isolating oneself).  Expressing feelings of guilt.  Expressing suicidal thoughts or feelings.  What do I need to know about the treatment options? This condition is usually treated by mental health providers such as psychologists, psychiatrists, and clinical social workers. Treatment may include one or more of the  following:  Psychotherapy, also called talk therapy or counseling. Types of psychotherapy include individual or group therapy, and they usually involve the following approaches: ? Cognitive behavioral therapy (CBT). This type of therapy teaches a person how to recognize feelings, thoughts, and behaviors that contribute to depression. The person is taught to make a choice about how to respond to these feelings, thoughts, and behaviors so that he or she can experience fewer symptoms. ? Interpersonal therapy (IPT). This helps to improve the way that someone with depression relates to and communicates with others. This type of therapy may involve caregivers and loved ones. ? Family therapy. This treatment helps family members to communicate and deal with conflict in healthy ways.  Medicine. This is often used to help with certain emotions and behaviors.  Combining medicine and therapy is often the most effective approach. The following lifestyle changes may also help with managing symptoms of depression:  Limiting alcohol and drug use.  Exercising regularly.  Getting enough good-quality sleep.  Making healthy eating choices.  Reducing distressing situations.  Spending time outside.  Following regular daily routines.  How can I support my loved one? Talk about the condition Good communication is the key to supporting your friend or family member. Here are a few things to keep in mind:  Ask your loved one how you can support him or her.  Respect your loved one's right to make decisions.  Be careful about too much prodding. Try not to overdo reminders to an adult friend or family member about things like taking medicines. Ask how your loved one prefers that you help.  Be encouraging and offer emotional support. This can help to lower stress. Even saying something simple to comfort your loved one may help.  Never ignore comments about suicide, and do not try to avoid the subject of  suicide. Talking about suicide will not make your loved one want to act on it. You or your loved one can reach out 24 hours a day to get free, private support (on the phone or a live online chat) from a suicide crisis helpline, such as the National Suicide Prevention Lifeline at (530)852-0880.  Listening is very important. Be available if your friend or family member wants to talk. Make an effort to acknowledge his or her feelings and stay calm and realistic.  Find support and resources A health care provider may be able to recommend mental health resources that are available online or over the phone. You could start with:  Government sites such as the Substance Abuse and Mental Health Services Administration (SAMHSA): SkateOasis.com.pt  National mental health organizations such as the [employer] on Mental Illness (NAMI): www.nami.org  You may also consider:  Joining self-help and support groups, not only for your friend or family member, but also for yourself. People in these peer and family support groups understand what you and your loved one are going through. They can help you feel a sense of hope and connect you with local resources to help you learn more.  Attending family therapy with your loved one.  General support  Make an effort to learn all you can about depression.  Help your loved one follow his  or her treatment plan as directed by health care providers. This could mean driving him or her to therapy sessions or suggesting ways to cope with stress.  Ask your loved one if you may join him or her for a therapy session or go with him or her to health care visits. Joining your loved one with his or her permission can give you an opportunity to learn how to be more supportive.  Include your loved one in activities. Invite her or him to go for walks and outings. At first, your loved one may not want to, but keep trying.  Be patient and do not expect your loved one to do too  much too soon.  Help with daily responsibilities, such as laundry or meals. Sometimes daily tasks seem overwhelming to a person with depression.  Remember that your support really matters. Social support is a huge benefit for someone who is coping with depression. How can I create a safe environment? If your loved one feels unable to control his or her behavior, it may be necessary to take steps to keep his or her home safe. Such steps may include:  Locking up alcohol and prescription pills that your loved one may turn to. Count prescription pills often. You may want to consider removing alcohol from the home.  Removing or locking up guns and other weapons. If you do not have a safe place to keep a gun, local law enforcement may store a gun for you.  Making a written crisis plan. Include important phone numbers, such as the local crisis intervention team. Make sure that: ? The person with depression knows about this plan. ? Everyone who has regular contact with that person knows about the plan and knows what to do in an emergency.  How should I care for myself? \[email or web address]

## 2017-06-02 NOTE — Progress Notes (Signed)
Patient presents to clinic today to f/u on chronic issues and to establish care.  Patient is accompanied by his Sister, William Cummings.  Patient sister they typically brings him to his appointments, but she is just leaving work.  SUBJECTIVE: PMH:  Pt is a 77 yo male with pmh sig for Parkinson's Diseases and dementia, and depression.  [facility]  Last CPE Oct 2018.  Parkinson's Dz/dementia: -Patient is followed by neurology, Dr. Lurena Joinerebecca Tat -Last seen on 06/17/16 -Patient has not been given refills on carbidopa levodopa secondary to missing an appointment in November. -Per patient's family he has an upcoming appointment on February 14. -Patient is supposed to be on carbidopa levodopa 25/102 tabs in a.m., 1 in the afternoon, 1 in the evening and then carbidopa levodopa 50/200 at night -Per review of neurologist previous notes there was concerns that patient was unable to remember to take the medication 4 times daily.  William Cummings states William HerrlichFaye (pt's other sister) calls or text patient daily to remind him to take his meds.  Depression: -Patient states he is taking Zoloft -Patient states he has been taking it for a while -Pt states his mood is "not bad", sleep "fairly well", energy "pretty low"  Allergies: NKDA  Past surgical history: None  Social history: Patient is divorced.  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  [facility]  Pt does not get help with meds.  Pt does not use tobacco products or drugs.  Patient may rarely drink a beer.  Family medical history: Mom-deceased Dad-deceased, MI Sister-Joanne, deceased, cervical cancer, early death Sister-Francine, alive Sister-Gale, alive Sister-Fay, alive Brother-Martin, alive Brother-Larry, alive, depression Brother-Harold, alive Son-alive   Health Maintenance: Dental -- Dr. Darl PikesAnbec DeShield Immunizations -- ?   Influenza vaccine 2018  Past Medical History:  Diagnosis Date  . Depression   . Parkinson's disease (HCC)     History reviewed. No pertinent surgical history.  Current Outpatient Medications on File Prior to Visit  Medication Sig Dispense Refill  . carbidopa-levodopa (SINEMET CR) 50-200 MG tablet Take 1 tablet by mouth at bedtime. 30 tablet 5  . carbidopa-levodopa (SINEMET IR) 25-100 MG tablet take 2 tablets by mouth every morning and take 1 tablet IN THE AFTERNOON AND THE EVENING 120 tablet 5  . sertraline (ZOLOFT) 100 MG tablet Take 200 mg by mouth daily.     No current facility-administered medications on file prior to visit.     No Known Allergies  Family History  Problem Relation Age of Onset  . Heart attack Father   . Cancer Sister   . Early death Sister   . Depression Brother     Social History   Socioeconomic History  . Marital status: Divorced    Spouse name: Not on file  . Number of children: Not on file  . Years of education: Not on file  . Highest education level: Not on file  Social Needs  . Financial resource strain: Not on file  . Food insecurity - worry: Not on file  . Food insecurity - inability: Not on file  . Transportation needs - medical: Not on file  . Transportation needs - non-medical: Not on file  Occupational History  . Occupation: Retired    Comment: Atty.  Tobacco Use  . Smoking status: Never Smoker  . Smokeless tobacco: Never Used  Substance and Sexual Activity  . Alcohol use:  Yes    Alcohol/week: 0.0 oz    Comment: glass of wine once every couple months  . Drug use: No  . Sexual activity: Not on file  Other Topics Concern  . Not on file  Social History Narrative  . Not on file    ROS General: Denies fever, chills, night sweats, changes in weight, changes in appetite HEENT: Denies headaches, ear pain, changes in vision, rhinorrhea, sore throat CV: Denies CP, palpitations, SOB, orthopnea Pulm: Denies SOB, cough,  wheezing GI: Denies abdominal pain, nausea, vomiting, diarrhea, constipation GU: Denies dysuria, hematuria, frequency, vaginal discharge Msk: Denies muscle cramps, joint pains Neuro: Denies weakness, numbness, tingling   +Parkinson's dz and dementia Skin: Denies rashes, bruising Psych: Denies anxiety, hallucinations  +depression  BP 110/80 (BP Location: Left Arm, Patient Position: Sitting, Cuff Size: Normal)   Pulse 93   Temp 97.8 F (36.6 C) (Oral)   Ht 5' 6.5" (1.689 m)   Wt 150 lb 8 oz (68.3 kg)   SpO2 95%   BMI 23.93 kg/m   Physical Exam Gen. Pleasant, well developed, well-nourished, in NAD HEENT - Espy/AT, PERRL, no scleral icterus, no nasal drainage, pharynx without erythema or exudate.  TMs occluded with cerumen bilaterally.  TMs visualized and normal after irrigation Neck: No JVD, no thyromegaly, no carotid bruits Lungs: no use of accessory muscles, CTAB, no wheezes, rales or rhonchi Cardiovascular: RRR, No r/g/m, no peripheral edema Abdomen: BS present, soft, nontender,nondistended, no hepatosplenomegaly Musculoskeletal: No deformities, moves all four extremities, no cyanosis or clubbing, normal tone Neuro:  A&Ox3, CN II-XII intact, resting tremor in R hand Skin:  Warm, dry, intact, no lesions Psych: normal affect, mood appropriate-making jokes  No results found for this or any previous visit (from the past 2160 hour(s)).  Assessment/Plan: Parkinson's disease (HCC) -Patient and sister encouraged to follow-up with neurology, Dr. Arbutus Leas next wk -Advised patient sister to consider thinking about options for the future progresses.  Depression, recurrent (HCC) -continue Zoloft 200 mg daily  Bilateral impacted cerumen -Bilateral ears irrigated  Encounter to establish care -We reviewed the PMH, PSH, FH, SH, Meds and Allergies. -We provided refills for any medications we will prescribe as needed. -We addressed current concerns per orders and patient instructions. -We have  asked for records for pertinent exams, studies, vaccines and notes from previous providers. -We have advised patient to follow up per instructions below.   F/u in the next 2 months for chronic issues.  Can discuss advanced directives at this visit.   Abbe Amsterdam, MD

## 2017-06-07 NOTE — Progress Notes (Signed)
William Cummings [facility] was seen today in the movement disorders clinic for neurologic consultation at the request of William Ruddy, MD.  The consultation is for the evaluation of Parkinsons disease.    This patient is accompanied in the office by his sister who supplements the history.  Pt states that he was dx 1 year ago in [place] but sx's started about a year prior to that.  First sx was tremor in the R hand and now tremor in the R foot.  No fam hx of PD.  Started on carbidopa/levodopa 25/100 about a year ago in [place] and it helped.  Moved to [place] last Aug, but hasn't had neurologist since moved here.  Currently takes carbidopa/levodopa 25/100 after breakfast after breakfast at 9:30am/after lunch at 2pm/after dinner at 6:45 and bedtime 11:00pm.  He can tell when the medication wears off.  His sister reports that he lives in independent assisted living and they have noticed that he has too many pills left at the end of the month.  At one point, they realized he was only taking one levodopa per day, but they do think he is taking more than that currently.  12/09/15 update:  The patient follows up today.  I have not seen him since his March visit, as he canceled his June visit at the same day as the visit.  He is supposed to be on carbidopa/levodopa 25/100, one tablet at 7 AM/11 AM/3 PM/7 PM and last visit I added carbidopa/levodopa 50/200 at bedtime; however he states that when he added the bedtime CR he dropped one tablet of the IR so he is only taking three of those throughout the day.   Sister came in part way through the visit and states that he just couldn't remember to take it four times a day.   Lives at [facility] but he cannot afford for them to do meds.  They do meals for him.    I also referred him for home physical therapies.   He cannot remember how long he did this.  He doesn't drive.     No cramping of the feet and toes at night.    Wearing off:  Yes.    How long before next dose:  n/a Falls:    No. N/V:  No. Hallucinations:  No.  visual distortions: No. Lightheaded:  No.  Syncope: No. Dyskinesia:  No.   06/17/16 update:  Patient follows up today.  His sister is apparently here but she doesn't come back to the room today.   I have not seen him in about 6 months.  He is prescribed carbidopa/levodopa 25/100, 2 tablets in the morning, one in the afternoon and one in the evening in addition to carbidopa/levodopa 50/200 at bedtime.  He states that he is doing pretty well.  No falls.  Not exercising.  Memory has been fair - "I still remember where my room and the dining room is."  No visual distortions or hallucinations.  No lightheadedness.    06/09/17 update: Patient is seen today in follow-up.  He is accompanied by his sister who supplements the history.  I have not seen him in 1 year.  He no showed his August appointment and canceled the November appointment.  He reports that he still takes carbidopa/levodopa 25/100, 2 tablets in the morning (8-9am), 1 in the afternoon (misses that one 50% of time at least ) and 1 in the evening (5-5:30) as well as carbidopa/levodopa 50/200 at bedtime.  His sister called him to remind him to take his medication.  Despite that, his sister says he doesn't like to take the medication.  He calls it a "nuisance."  He has no SE with the medication. He was just seen by his primary care physician on June 02, 2017.  I have reviewed those records.  No falls.  No hallucinations.    PREVIOUS MEDICATIONS: Sinemet  ALLERGIES:  No Known Allergies  CURRENT MEDICATIONS:  Outpatient Encounter Medications as of 06/09/2017  Medication Sig  . carbidopa-levodopa (SINEMET CR) 50-200 MG tablet Take 1 tablet by mouth at bedtime.  . carbidopa-levodopa (SINEMET IR) 25-100 MG tablet take 2 tablets by mouth every morning and take 1 tablet IN THE AFTERNOON AND THE EVENING  . sertraline (ZOLOFT) 100 MG tablet Take 200 mg by mouth daily.   No facility-administered encounter  medications on file as of 06/09/2017.     PAST MEDICAL HISTORY:   Past Medical History:  Diagnosis Date  . Depression   . Parkinson's disease ([place])     PAST SURGICAL HISTORY:  History reviewed. No pertinent surgical history.  SOCIAL HISTORY:   Social History   Socioeconomic History  . Marital status: Divorced    Spouse name: Not on file  . Number of children: Not on file  . Years of education: Not on file  . Highest education level: Not on file  Social Needs  . Financial resource strain: Not on file  . Food insecurity - worry: Not on file  . Food insecurity - inability: Not on file  . Transportation needs - medical: Not on file  . Transportation needs - non-medical: Not on file  Occupational History  . Occupation: Retired    Comment: Atty.  Tobacco Use  . Smoking status: Never Smoker  . Smokeless tobacco: Never Used  Substance and Sexual Activity  . Alcohol use: Yes    Alcohol/week: 0.0 oz    Comment: glass of wine once every couple months  . Drug use: No  . Sexual activity: Not on file  Other Topics Concern  . Not on file  Social History Narrative  . Not on file    FAMILY HISTORY:   Family Status  Relation Name Status  . Mother  Deceased       HTN  . Father  Deceased       HTN  . Brother McKesson       x 6  . Sister Mechele Claude Deceased       x1  . Son  Alive  . Sister [employer]  . Sister Hyman Hopes Alive  . Brother [employer]  . Brother [employer]    ROS:  A complete 10 system review of systems was obtained and was unremarkable apart from what is mentioned above.  PHYSICAL EXAMINATION:    VITALS:   Vitals:   06/09/17 1419  BP: 122/74  Pulse: 66  SpO2: 98%  Weight: 147 lb (66.7 kg)  Height: 5' 7.5" (1.715 m)    GEN:  The patient appears stated age and is in NAD. HEENT:  Normocephalic, atraumatic.  The mucous membranes are moist. The superficial temporal arteries are without ropiness or tenderness. CV:  RRR Lungs:  CTAB Neck/HEME:   There are no carotid bruits bilaterally.  Neurological examination:  Orientation:  Montreal Cognitive Assessment  06/09/2017 06/17/2016 07/10/2015  Visuospatial/ Executive (0/5) _0 Naming (0/3) _1 Attention: Read list of digits (0/2) 2 2 2  Attention: Read list of letters (0/1) _0 Attention: Serial 7 subtraction starting at 100 (0/3) _1 Language: Repeat phrase (0/2) _2 Language : Fluency (0/1) 0 1 1  Abstraction (0/2) _3 Delayed Recall (0/5) 0 0 0  Orientation (0/6) _4 Total _5 Adjusted Score (based on education) - - 23   Cranial nerves: There is good facial symmetry. There is facial hypomimia.  Extraocular muscles are intact. The visual fields are full to confrontational testing. The speech is fluent and clear but hypophonic. Soft palate rises symmetrically and there is no tongue deviation. Hearing is intact to conversational tone. Sensation: Sensation is intact to light touch throughout Motor: Strength is 5/5 in the bilateral upper and lower extremities.   Shoulder shrug is equal and symmetric.  There is a right pronator drift   Movement examination: Tone: There is mild increased tone in the bilateral UE Abnormal movements: There is RUE and RLE resting tremor that is mild to moderate in nature but intermittent (taken no medication today as ran out) Coordination:  There is  Some decremation with RAMs bilaterally, R more than L Gait and Station: The patient is unable to arise without the use of his hands, despite several attempts.  He pushes off of the chair to get up.  Once up, gait is somewhat short stepped.  ASSESSMENT/PLAN:  1.  Idiopathic Parkinson's disease.  The patient has tremor, bradykinesia, rigidity and mild postural instability.  -Cannot take the daytime dose of levodopa more than 3 times a day, primarily because he cannot remember it and his sister has to call him and remind him to take medication.  He does live at [facility], but  cannot afford the medication distribution part.  His sister would like to try and go up on the dosage, for ease of dosing.  Explained to her that this wouldn't change dosing frequency.  In addition, he is already missing the middle of the day dose most of the time.  For now, he will continue carbidopa/levodopa 25/100, 2 tablets in the morning, 1 in the afternoon (an alarm was actually set for him in the office to go off at noon to remind him), one in the evening and then continue carbidopa/levodopa 50/200 at night.  -We are looking into scat transportation for the patient so that he can get to community exercise programs, including the drumming program that is free to patients.  -We discussed PT but he cannot afford.  He still owes $600 from sessions 6 months ago. 2.  Parkinson's dementia  -Even though the patient does well on his MoCA, I suspect that he does have Parkinson's dementia and that he does well in testing because he is highly educated.  I am concerned that he will not be able to live alone in independent living without much longer.  However, cost is a big issue for them, and they do seem to be managing okay right the second.  His Moca has not declined over the last year.   3.  I will plan on seeing the patient in the next 4-5 months, sooner should new neurologic issues arise.  Compliance with office visits was stressed.  He met with our PD social worker today.  Much greater than 50% of this visit was spent in counseling and coordinating care.  Total face to face time:  30 min

## 2017-06-09 ENCOUNTER — Encounter: Payer: Self-pay | Admitting: Neurology

## 2017-06-09 ENCOUNTER — Encounter: Payer: Self-pay | Admitting: Psychology

## 2017-06-09 ENCOUNTER — Ambulatory Visit (INDEPENDENT_AMBULATORY_CARE_PROVIDER_SITE_OTHER): Payer: Medicare HMO | Admitting: Neurology

## 2017-06-09 VITALS — BP 122/74 | HR 66 | Ht 67.5 in | Wt 147.0 lb

## 2017-06-09 DIAGNOSIS — F0280 Dementia in other diseases classified elsewhere without behavioral disturbance: Secondary | ICD-10-CM

## 2017-06-09 DIAGNOSIS — G20 Parkinson's disease: Secondary | ICD-10-CM

## 2017-06-09 MED ORDER — CARBIDOPA-LEVODOPA 25-100 MG PO TABS: ORAL_TABLET | ORAL | 1 refills | Status: DC

## 2017-06-09 MED ORDER — CARBIDOPA-LEVODOPA ER 50-200 MG PO TBCR: 1.0000 | EXTENDED_RELEASE_TABLET | Freq: Every day | ORAL | 1 refills | Status: DC

## 2017-06-09 NOTE — Progress Notes (Signed)
I met with the patient and his sister while they were in the clinic today.  2 things were identified one is afternoon medication compliance and the second item was transportation so the patient has option to participate in exercise classes.  I provided the patient's sister with a copy of the scat application for her to complete and return back to me.  After I receive part 1 of the application I will complete the second part and fax it over to the scat office.   We talked more in depth about the afternoon dosing of the medication.  The patient is compliant with the morning and evening doses but in the afternoon he leaves for lunch and then does not return back to his room for several hours.  His sister works in a [employer] so this is also the medication time that she is not able to remind him about.  Since his medication needs to be taken 30 minutes prior to his meal I think that a modification can be made to remind him of this before he leaves for lunch and for the afternoon.  He normally does not go downstairs for lunch a few minutes before they will serve.  His lunch is served around 1230 at the independent living facility therefore I set an alarm for 12:00 every day on his phone.  He is used to receiving text messages as reminders so this has the potential of being effective.  I asked his sister to fill up his medication box with the dosing that is recommended by Dr. Carles Collet and we will evaluate in a week or not if the alarm is working.  If this strategy does not work out we will attempt to problem solve a different way of doing this to ensure medication compliance.

## 2017-06-09 NOTE — Patient Instructions (Signed)
Continue carbidopa/levodopa 25/100, 2 in the AM, 1 at noon, 1 at 5pm Continue carbidopa/levodopa 50/200 at bedtime

## 2017-06-15 ENCOUNTER — Encounter (HOSPITAL_COMMUNITY): Payer: Self-pay

## 2017-06-15 ENCOUNTER — Other Ambulatory Visit: Payer: Self-pay

## 2017-06-15 ENCOUNTER — Emergency Department (HOSPITAL_COMMUNITY): Payer: Medicare HMO

## 2017-06-15 ENCOUNTER — Emergency Department (HOSPITAL_COMMUNITY)
Admission: EM | Admit: 2017-06-15 | Discharge: 2017-06-15 | Disposition: A | Discharge status: Home / Self Care | Payer: Medicare HMO | Attending: Emergency Medicine | Admitting: Emergency Medicine

## 2017-06-15 DIAGNOSIS — R040 Epistaxis: Secondary | ICD-10-CM | POA: Insufficient documentation

## 2017-06-15 DIAGNOSIS — W19XXXA Unspecified fall, initial encounter: Secondary | ICD-10-CM

## 2017-06-15 DIAGNOSIS — S80211A Abrasion, right knee, initial encounter: Secondary | ICD-10-CM | POA: Diagnosis not present

## 2017-06-15 DIAGNOSIS — W1830XA Fall on same level, unspecified, initial encounter: Secondary | ICD-10-CM | POA: Diagnosis not present

## 2017-06-15 DIAGNOSIS — G20 Parkinson's disease: Secondary | ICD-10-CM | POA: Diagnosis not present

## 2017-06-15 DIAGNOSIS — Y92031 Bathroom in apartment as the place of occurrence of the external cause: Secondary | ICD-10-CM | POA: Diagnosis not present

## 2017-06-15 DIAGNOSIS — Y999 Unspecified external cause status: Secondary | ICD-10-CM | POA: Diagnosis not present

## 2017-06-15 DIAGNOSIS — S80212A Abrasion, left knee, initial encounter: Secondary | ICD-10-CM | POA: Insufficient documentation

## 2017-06-15 DIAGNOSIS — Y939 Activity, unspecified: Secondary | ICD-10-CM | POA: Insufficient documentation

## 2017-06-15 DIAGNOSIS — Z043 Encounter for examination and observation following other accident: Secondary | ICD-10-CM | POA: Diagnosis present

## 2017-06-15 LAB — BASIC METABOLIC PANEL
ANION GAP: 10 (ref 5–15)
BUN: 22 mg/dL — ABNORMAL HIGH (ref 6–20)
CO2: 22 mmol/L (ref 22–32)
Calcium: 9.2 mg/dL (ref 8.9–10.3)
Chloride: 104 mmol/L (ref 101–111)
Creatinine, Ser: 0.91 mg/dL (ref 0.61–1.24)
Glucose, Bld: 113 mg/dL — ABNORMAL HIGH (ref 65–99)
POTASSIUM: 3.9 mmol/L (ref 3.5–5.1)
SODIUM: 136 mmol/L (ref 135–145)

## 2017-06-15 LAB — CBC WITH DIFFERENTIAL/PLATELET
BASOS ABS: 0 10*3/uL (ref 0.0–0.1)
Basophils Relative: 0 %
EOS PCT: 0 %
Eosinophils Absolute: 0 10*3/uL (ref 0.0–0.7)
HCT: 41.5 % (ref 39.0–52.0)
HEMOGLOBIN: 14.6 g/dL (ref 13.0–17.0)
LYMPHS PCT: 6 %
Lymphs Abs: 0.6 10*3/uL — ABNORMAL LOW (ref 0.7–4.0)
MCH: 31.5 pg (ref 26.0–34.0)
MCHC: 35.2 g/dL (ref 30.0–36.0)
MCV: 89.6 fL (ref 78.0–100.0)
Monocytes Absolute: 0.7 10*3/uL (ref 0.1–1.0)
Monocytes Relative: 6 %
NEUTROS PCT: 88 %
Neutro Abs: 9.1 10*3/uL — ABNORMAL HIGH (ref 1.7–7.7)
PLATELETS: 179 10*3/uL (ref 150–400)
RBC: 4.63 MIL/uL (ref 4.22–5.81)
RDW: 12.9 % (ref 11.5–15.5)
WBC: 10.4 10*3/uL (ref 4.0–10.5)

## 2017-06-15 LAB — URINALYSIS, ROUTINE W REFLEX MICROSCOPIC
Bilirubin Urine: NEGATIVE
Glucose, UA: NEGATIVE mg/dL
Hgb urine dipstick: NEGATIVE
Ketones, ur: 5 mg/dL — AB
LEUKOCYTES UA: NEGATIVE
Nitrite: NEGATIVE
PROTEIN: NEGATIVE mg/dL
Specific Gravity, Urine: 1.019 (ref 1.005–1.030)
pH: 6 (ref 5.0–8.0)

## 2017-06-15 LAB — CBG MONITORING, ED: GLUCOSE-CAPILLARY: 104 mg/dL — AB (ref 65–99)

## 2017-06-15 MED ORDER — SODIUM CHLORIDE 0.9 % IV BOLUS (SEPSIS)
500.0000 mL | Freq: Once | INTRAVENOUS | Status: AC
  Administered 2017-06-15: 500 mL via INTRAVENOUS

## 2017-06-15 NOTE — ED Provider Notes (Signed)
[facility]-EMERGENCY DEPT Provider Note   CSN: [id number] Arrival date & time: 06/15/17  1110     History   Chief Complaint Chief Complaint  Patient presents with  . Fall    HPI William Cummings is a 77 y.o. male.  Patient was found sitting in a chair by a caregiver, in his bathroom  He states that he remembers falling but cannot give much history about it.  Patient talks slowly and is somewhat difficult to understand but is able to give some history.  He denies headache, neck pain, face pain, chest pain, back pain or extremity pain.  He denies any recent illnesses.  There are no other known modifying factors.  HPI  Past Medical History:  Diagnosis Date  . Depression   . Parkinson's disease (HCC)     There are no active problems to display for this patient.   History reviewed. No pertinent surgical history.     Home Medications    Prior to Admission medications   Medication Sig Start Date End Date Taking? Authorizing Provider  carbidopa-levodopa (SINEMET CR) 50-200 MG tablet Take 1 tablet by mouth at bedtime. 06/09/17   Tat, Octaviano Batty, DO  carbidopa-levodopa (SINEMET IR) 25-100 MG tablet 2 in the AM, 1 in the afternoon, 1 in the evening 06/09/17   Tat, Rebecca S, DO  sertraline (ZOLOFT) 100 MG tablet Take 200 mg by mouth daily.    [provider]    Family History Family History  Problem Relation Age of Onset  . Heart attack Father   . Cancer Sister   . Early death Sister   . Depression Brother     Social History Social History   Tobacco Use  . Smoking status: Never Smoker  . Smokeless tobacco: Never Used  Substance Use Topics  . Alcohol use: Yes    Alcohol/week: 0.0 oz    Comment: glass of wine once every couple months  . Drug use: No     Allergies   Patient has no known allergies.   Review of Systems Review of Systems  All other systems reviewed and are negative.    Physical Exam Updated Vital Signs BP  122/73   Pulse 64   Temp 99.1 F (37.3 C) (Oral)   Resp 19   Ht 5' 7.5" (1.715 m)   Wt 72.6 kg (160 lb)   SpO2 99%   BMI 24.69 kg/m   Physical Exam  Constitutional: He is oriented to person, place, and time. He appears well-developed. No distress.  Elderly, frail  HENT:  Head: Normocephalic.  Right Ear: External ear normal.  Left Ear: External ear normal.  No midface tenderness or swelling.  No active bleeding from the nose.  No visible or palpable facial contusion or injury  Eyes: Conjunctivae and EOM are normal. Pupils are equal, round, and reactive to light.  Neck: Normal range of motion and phonation normal. Neck supple.  Cardiovascular: Normal rate, regular rhythm and normal heart sounds.  Pulmonary/Chest: Effort normal and breath sounds normal. He exhibits no bony tenderness.  Abdominal: Soft. There is no tenderness.  Musculoskeletal: Normal range of motion.  Neurological: He is alert and oriented to person, place, and time. No cranial nerve deficit or sensory deficit. He exhibits normal muscle tone. Coordination normal.  Extremities with cogwheeling rigidity consistent with Parkinson's disease  Skin: Skin is warm, dry and intact.  Psychiatric: He has a normal mood and affect. His behavior is normal.  Nursing note  and vitals reviewed.    ED Treatments / Results  Labs (all labs ordered are listed, but only abnormal results are displayed) Labs Reviewed  BASIC METABOLIC PANEL - Abnormal; Notable for the following components:      Result Value   Glucose, Bld 113 (*)    BUN 22 (*)    All other components within normal limits  CBC WITH DIFFERENTIAL/PLATELET - Abnormal; Notable for the following components:   Neutro Abs 9.1 (*)    Lymphs Abs 0.6 (*)    All other components within normal limits  URINALYSIS, ROUTINE W REFLEX MICROSCOPIC - Abnormal; Notable for the following components:   Ketones, ur 5 (*)    All other components within normal limits  CBG MONITORING, ED -  Abnormal; Notable for the following components:   Glucose-Capillary 104 (*)    All other components within normal limits    EKG  EKG Interpretation  Date/Time:  Wednesday June 15 2017 11:24:55 EST Ventricular Rate:  86 PR Interval:    QRS Duration: 125 QT Interval:  380 QTC Calculation: 455 R Axis:   -59 Text Interpretation:  Sinus rhythm Consider right atrial enlargement RBBB and LAFB Artifact No old tracing to compare Confirmed by Mancel BaleWentz, Makinzi Prieur [phone]([id number]) on 06/15/2017 11:35:03 AM       Radiology Dg Chest 2 View  Result Date: 06/15/2017 CLINICAL DATA:  Pain following fall EXAM: CHEST  2 VIEW COMPARISON:  None. FINDINGS: Lungs are clear. Heart size and pulmonary vascularity are normal. No adenopathy. No pneumothorax. There is aortic atherosclerosis. There is degenerative change in the thoracic spine. No acute fracture evident. IMPRESSION: No edema or consolidation. Aortic atherosclerosis. No evident pneumothorax. Aortic Atherosclerosis (ICD10-I70.0). Electronically Signed   By: Bretta BangWilliam  Woodruff III M.D.   On: 06/15/2017 12:36    Procedures Procedures (including critical care time)  Medications Ordered in ED Medications  sodium chloride 0.9 % bolus 500 mL (0 mLs Intravenous Stopped 06/15/17 1228)     Initial Impression / Assessment and Plan / ED Course  I have reviewed the triage vital signs and the nursing notes.  Pertinent labs & imaging results that were available during my care of the patient were reviewed by me and considered in my medical decision making (see chart for details).  Clinical Course as of Jun 15 1616  Wed Jun 15, 2017  1206 Ambulation trial, patient was walked with staff holding one arm H, and had difficulty placing his feet, to walk in the tandem gait.  At one point he tended to crumple to the right side.  He did not fall.  Patient was able to ambulate about 12 feet, then back to his bed.  Will evaluate for toxic metabolic and infectious processes.   Doubt significant intracranial injury, spine injury or extremity fracture  [EW]    Clinical Course User Index [EW] Mancel BaleWentz, Sady Monaco, MD     Patient Vitals for the past 24 hrs:  BP Temp Temp src Pulse Resp SpO2 Height Weight  06/15/17 1430 122/73 - - 64 19 99 % - -  06/15/17 1300 126/70 - - 69 18 98 % - -  06/15/17 1230 (!) 149/90 - - 80 (!) 21 100 % - -  06/15/17 1130 (!) 164/82 99.1 F (37.3 C) Oral 84 19 100 % - -  06/15/17 1130 (!) 156/86 - - 81 20 99 % - -  06/15/17 1125 - - - - - - 5' 7.5" (1.715 m) 72.6 kg (160 lb)  06/15/17 1115 - - - - -  98 % - -    4:18 PM Reevaluation with update and discussion. After initial assessment and treatment, an updated evaluation reveals patient remains alert and comfortable and has no further complaints.  His sister is here now, she has spoken with care management and understands the plan and is in agreement.  Patient has no further questions. Mancel Bale      Final Clinical Impressions(s) / ED Diagnoses   Final diagnoses:  Fall, initial encounter  Nosebleed  Parkinson's disease (HCC)   Fall without serious injury.  Screening evaluation with blood work, and urinalysis, was done to evaluate for infectious or metabolic abnormalities.  Urinalysis negative, electrolyte panel normal, CBC is normal.  A chest x-ray does not indicate pneumonia, and EKG does not show ischemia or arrhythmia.  Nursing Notes Reviewed/ Care Coordinated Applicable Imaging Reviewed Interpretation of Laboratory Data incorporated into ED treatment  The patient appears reasonably screened and/or stabilized for discharge and I doubt any other medical condition or other [facility] requiring further screening, evaluation, or treatment in the ED at this time prior to discharge.  Plan: Home Medications-continue usual medications; Home Treatments-rest, fluids, be careful when getting up, home health evaluation in his home; return here if the recommended treatment, does not improve the  symptoms; Recommended follow up-PCP checkup as soon as possible   ED Discharge Orders        Ordered    Home Health     06/15/17 1616    Face-to-face encounter (required for Medicare/Medicaid patients)    Comments:  I Mancel Bale certify that this patient is under my care and that I, or a nurse practitioner or physician's assistant working with me, had a face-to-face encounter that meets the physician face-to-face encounter requirements with this patient on 06/15/2017. The encounter with the patient was in whole, or in part for the following medical condition(s) which is the primary reason for home health care (List medical condition): Gait instability   06/15/17 1616       Mancel Bale, MD 06/15/17 1620

## 2017-06-15 NOTE — ED Triage Notes (Addendum)
[facility]. Pt came in via [facility]. Pt has history of parkinsons disease. Pt was sitting in chair in bathroom, caregiver noticed dry blood in nostrils and minor abrasions to bilateral knees. Pt stated he did fall earlier. Pt hit head but unsure of where. Pt is AOx4. Pt can ambulate with 1 person assist/escort.

## 2017-06-15 NOTE — ED Notes (Signed)
Attempted to ambulate pt with 2 person assist. RN spoke with ED Provider and is awaiting further instructions.

## 2017-06-15 NOTE — ED Notes (Signed)
Spoke with ED Provider will ambulate pt with Nurse Tech.

## 2017-06-15 NOTE — Care Management Note (Signed)
Case Management Note  Patient Details  Name: William Cummings MRN: [id number] Date of Birth: 12/11/1940   [facility]  Spoke with pt and sister at bedside at length about future plans financially and levels of care.  Discussed HH agencies and what [facility] provide.  [name] who advised they were not in-network with pt's ins provider.  Contacted Lisa with [facility] who accepted the pt and reminded this CM that they are owned by [employer] as of last year.  CM advised her of possible needs from conversation had with pt and sister.  Advised pt's brother in [place] would be involved with any planning and decisions as well as pt's son, POA with finances and health care, who [place] in [place].  Pt additionally has a PCS aid that comes x2 a week to assist with bathing.  Updated Dr. Effie ShyWentz and pt/sister.  No further CM needs noted at this time.  Expected Discharge Date:   06/15/2017               Expected Discharge Plan:  Home w Home Health Services  Discharge planning Services  CM Consult  Post Acute Care Choice:  Home Health Choice offered to:  Patient, Sibling  HH Arranged:  RN, PT, Nurse's Aide, Social Work [facility]  Status of Service:  Completed, signed off  Futures traderKritzer, Lynnae Sandhoffngela N, RN 06/15/2017, 4:25 PM

## 2017-06-15 NOTE — Discharge Instructions (Signed)
Be careful when you get up, to avoid falling.  If you feel weak, call for help, by using the notification system in your apartment.

## 2017-06-15 NOTE — ED Notes (Signed)
[name]: Cyndia BentFrancine: [phone]

## 2017-06-17 ENCOUNTER — Telehealth: Payer: Self-pay | Admitting: Family Medicine

## 2017-06-17 NOTE — Telephone Encounter (Signed)
William HughsKecia called from [facility], office did receive orders for home health and will be going out on Monday 2/252019 to start services.

## 2017-06-19 NOTE — Telephone Encounter (Signed)
This provider [facility]

## 2017-06-21 ENCOUNTER — Ambulatory Visit: Payer: Medicare Other | Admitting: Neurology

## 2017-06-22 ENCOUNTER — Telehealth: Payer: Self-pay | Admitting: Family Medicine

## 2017-06-22 NOTE — Telephone Encounter (Signed)
Copied from CRM [id number]. Topic: Quick Communication - See Telephone Encounter >> Jun 22, 2017  4:24 PM Windy KalataMichael, Pooja Camuso L, NT wrote: CRM for notification. See Telephone encounter for:  06/22/17.  William DusterMichelle, a physical therapist with [facility] is calling to request verbal orders for PT 2x a week for 1 week then 3x a week for 3 weeks for balance and basic training.  William DusterMichelle CB# [phone]

## 2017-06-23 NOTE — Telephone Encounter (Signed)
Louis MeckelCalled Michelle, at [phone] to give verbal orders per Dr Salomon FickBanks for PT 2x a week for 1 week then 3x a week for 3 weeks for balance and basic training.

## 2017-06-23 NOTE — Telephone Encounter (Signed)
Ok to give verbal orders? Please advise  

## 2017-06-23 NOTE — Telephone Encounter (Signed)
ok 

## 2017-07-18 ENCOUNTER — Telehealth: Payer: Self-pay | Admitting: Family Medicine

## 2017-07-18 NOTE — Telephone Encounter (Signed)
Copied from CRM [id number]. Topic: Quick Communication - See Telephone Encounter >> Jul 18, 2017 11:04 AM Guinevere FerrariMorris, Jeffree Cazeau E, NT wrote: CRM for notification. See Telephone encounter for: 07/18/17. Marcelino DusterMichelle call and wanted to get verbal orders for twice a week for two more weeks for PT. She would like a call back.

## 2017-07-18 NOTE — Telephone Encounter (Signed)
Ok to give verbal orders? Please advise  

## 2017-07-18 NOTE — Telephone Encounter (Signed)
ok 

## 2017-07-19 NOTE — Telephone Encounter (Signed)
Marcelino DusterMichelle, PT was called and verbal orders were given per Dr Salomon FickBanks.

## 2017-08-01 ENCOUNTER — Ambulatory Visit: Payer: Medicare HMO | Admitting: Family Medicine

## 2017-08-03 ENCOUNTER — Encounter: Payer: Self-pay | Admitting: Family Medicine

## 2017-08-03 ENCOUNTER — Ambulatory Visit (INDEPENDENT_AMBULATORY_CARE_PROVIDER_SITE_OTHER): Payer: Medicare HMO | Admitting: Family Medicine

## 2017-08-03 ENCOUNTER — Telehealth: Payer: Self-pay | Admitting: Family Medicine

## 2017-08-03 DIAGNOSIS — F339 Major depressive disorder, recurrent, unspecified: Secondary | ICD-10-CM | POA: Insufficient documentation

## 2017-08-03 DIAGNOSIS — G20 Parkinson's disease: Secondary | ICD-10-CM

## 2017-08-03 NOTE — Telephone Encounter (Signed)
Patient needs refills for Sertraline 100 mg tablet.  [facility] [address]

## 2017-08-03 NOTE — Progress Notes (Signed)
Subjective:    Patient ID: William Cummings, male    DOB: 08/30/1940, 77 y.o.   MRN: [id number]  No chief complaint on file. Pt accompanied by his sister.  HPI Patient was seen today for f/u.  Since last OFV, pt was seen by Neurology on 06/09/17.  Pt is to take carbidopa/levodopa 25/100, 2 tablets in the morning, 1 tablet in the afternoon, 1 tablet in the evening and then carbidopa/levodopa 50/200 at night.  Patient has difficulty remembering to take the afternoon dose.  Patient sisters will call to remind him of this dose.  On 06/15/17 patient had a fall while leaving the restroom at [facility] where he lives.  Patient was taken to the emergency department and later released.  Since being back at home, pt had a few sessions with PT to work on balance.    Pt states his mood is ok, his sleep is good "I'm out like a log", and his energy is ok.  Pt is taking Zoloft 100 mg daily.  Past Medical History:  Diagnosis Date  . Depression   . Parkinson's disease (HCC)     No Known Allergies  ROS General: Denies fever, chills, night sweats, changes in weight, changes in appetite HEENT: Denies headaches, ear pain, changes in vision, rhinorrhea, sore throat CV: Denies CP, palpitations, SOB, orthopnea Pulm: Denies SOB, cough, wheezing GI: Denies abdominal pain, nausea, vomiting, diarrhea, constipation GU: Denies dysuria, hematuria, frequency, vaginal discharge Msk: Denies muscle cramps, joint pains Neuro: Denies weakness, numbness, tingling  +tremor 2/2 Parkinson's dz.  Skin: Denies rashes, bruising Psych: Denies depression, anxiety, hallucinations     Objective:    Blood pressure 110/70, pulse 74, temperature 98.4 F (36.9 C), temperature source Oral, weight 152 lb 12.8 oz (69.3 kg), SpO2 94 %.   Gen. Pleasant, well-nourished, in no distress, normal affect   HEENT: Ashley/AT, face symmetric, no scleral icterus, PERRLA, nares patent without drainage, pharynx without erythema or exudate.   TMs normal bilaterally.  No cervical lymphadenopathy. Lungs: no accessory muscle use, CTAB, no wheezes or rales Cardiovascular: RRR, no m/r/g, no peripheral edema Abdomen: BS present, soft, NT/ND Musculoskeletal: No deformities, no cyanosis or clubbing Neuro:  A&Ox3, CN II-XII intact, cogwheel rigidity, tremor in right upper extremity. Skin:  Warm, no lesions/ rash   Wt Readings from Last 3 Encounters:  08/03/17 152 lb 12.8 oz (69.3 kg)  06/15/17 160 lb (72.6 kg)  06/09/17 147 lb (66.7 kg)    Lab Results  Component Value Date   WBC 10.4 06/15/2017   HGB 14.6 06/15/2017   HCT 41.5 06/15/2017   PLT 179 06/15/2017   GLUCOSE 113 (H) 06/15/2017   NA 136 06/15/2017   K 3.9 06/15/2017   CL 104 06/15/2017   CREATININE 0.91 06/15/2017   BUN 22 (H) 06/15/2017   CO2 22 06/15/2017    Assessment/Plan:  Parkinson's disease (HCC) -Continue carbidopa/levodopa 25/100, 2 tablets in the morning, 1 tablet in the afternoon, 1 tablet in the evening and then carbidopa/levodopa 50/200 at night. -Continue physical therapy. -Family reminded to remove any rugs or other obstacles patient may trip over. -Continue follow-up with neurology, Dr. Arbutus Leasat  Depression, recurrent (HCC) -Continue Zoloft 200 mg daily  Follow-up in the next 4-6 months as needed, sooner if needed  Abbe AmsterdamShannon Kieon Lawhorn, MD

## 2017-08-03 NOTE — Patient Instructions (Signed)
Parkinson Disease Parkinson disease is a long-term (chronic) condition that gets worse over time (is progressive). Parkinson disease limits your ability to control your movements and move your body normally. This condition is a type of movement disorder. Each person with Parkinson disease is affected differently. The condition can range from mild to severe. Parkinson disease tends to progress slowly over several years. What are the causes? Parkinson disease results from a loss of brain cells (neurons) in a specific part of the brain (substantia nigra). Some of the neurons in the substantia nigra make an important brain chemical (dopamine). Dopamine is needed to control movement. As the condition gets worse, neurons make less dopamine. This makes it hard to move or control your movements. The exact cause of why neurons are lost or produce less dopamine is not known. Genetic and environmental factors may contribute to the cause of Parkinson disease. What increases the risk? This condition is more likely to develop in:  Men.  People who are 60 years of age or older.  People who have a family history of Parkinson disease.  What are the signs or symptoms? Symptoms of this condition can vary from person to person. The main (primary) symptoms are related to movement (motor symptoms). These include:  Uncontrolled shaking movements (tremor). Tremors usually start in a hand or foot when you are resting (resting tremor). The tremor may stop when you move around.  Slowing of movement. You may lose facial expression and have trouble making the small movements that are needed to button clothing or brush your teeth. You may walk with short, shuffling steps.  Stiff movement (rigidity). This mostly affects your arms, legs, neck, and upper body. You may walk without swinging your arms. Rigidity can be painful.  Loss of balance and stability when standing. You may sway, fall backward, and have trouble making  turns.  Secondary motor symptoms of this condition include:  Shrinking handwriting.  Stooped posture.  Slowed speech.  Trouble swallowing.  Drooling.  Sexual dysfunction.  Muscle cramps.  Loss of smell.  Additional symptoms that are not related to movement include:  Constipation.  Mood swings.  Depression or anxiety.  Sleep disturbances.  Confusion.  Loss of mental abilities (dementia).  Low blood pressure.  Trouble concentrating.  How is this diagnosed? Parkinson disease can be hard to diagnose in its early stages. A diagnosis may be made based on symptoms, a medical history, and physical exam. During your exam, your health care provider will look for:  Lack of facial expression.  Resting tremor.  Stiffness in your neck, arms, and legs.  Abnormal walk.  Trouble with balance.  You may have brain imaging tests done to check for a loss of dopamine-producing areas of the brain. Your healthcare provider may also grade the severity of your condition as mild, moderate, or advanced. Parkinson disease progression is different for everyone. You may not progress to the advanced stage. Mild Parkinson disease involves:  Movement problems that do not affect daily activities.  Movement problems on one side of the body.  Movement problems that are controlled with medicines.  Good response to exercise.  Moderate Parkinson disease involves:  Movement problems on both sides of the body.  Slowing of movement.  Coordination and balance problems.  Less of a response to medicine.  More side effects from medicines.  Advanced Parkinson disease involves:  Extreme difficulty walking.  Inability to live alone safely.  Signs of dementia.  Difficulty controlling symptoms with medicine.  How is   this treated? There is no cure for Parkinson disease. Treatment focuses on relieving your symptoms. Treatment may include:  Medicines.  Speech, occupational, and  physical therapy.  Surgery.  Everyone responds to medicines differently. Your response may change over time. Work with your health care provider to find the best medicines for you. These may include:  Dopamine replacement drugs. These are the most effective medicines. A long-term side effect of these medicines is uncontrolled movements (dyskinesias).  Dopamine agonists. These drugs act like dopamine to stimulate dopamine receptors in the brain. Side effects include nausea and sleepiness, but they cause less dyskinesia.  Other medicines to reduce tremor, prevent dopamine breakdown, reduce dyskinesia, and reduce dementia that is related to Parkinson disease.  Another treatment is deep brain stimulation surgery to reduce tremors and dyskinesia. This procedure involves placing electrodes in the brain. The electrodes are attached to an electric pulse generator that acts like a pacemaker for your brain. This may be an option if you have had the condition for at least four years and are not responding well to medicines. Follow these instructions at home:  Take over-the-counter and prescription medicines only as told by your health care provider.  Install grab bars and railings in your home to prevent falls.  Follow instructions from your health care provider about eating or drinking restrictions.  Return to your normal activities as told by your health care provider. Ask your health care provider what activities are safe for you.  Get regular exercise as told by your health care provider or a physical therapist.  Keep all follow-up visits as told by your health care provider. This is important. These include any visits with a speech therapist or occupational therapist.  Consider joining a support group for people with Parkinson disease. Contact a health care provider if:  Medicines do not help your symptoms.  You are unsteady or have fallen at home.  You need more support to function well  at home.  You have trouble swallowing.  You have severe constipation.  You are struggling with side effects from your medicines.  You see or hear things that are not real (hallucinate).  You feel confused, anxious, or depressed. Get help right away if:  You are injured after a fall.  You cannot swallow without choking.  You have chest pain or trouble breathing.  You do not feel safe at home. This information is not intended to replace advice given to you by your health care provider. Make sure you discuss any questions you have with your health care provider. Document Released: 04/09/2000 Document Revised: 09/15/2015 Document Reviewed: 01/31/2015 Elsevier Interactive Patient Education  2018 Elsevier Inc.  

## 2017-08-04 NOTE — Telephone Encounter (Signed)
Ok to refill medication     Please advise

## 2017-08-04 NOTE — Telephone Encounter (Signed)
That's fine

## 2017-08-05 ENCOUNTER — Other Ambulatory Visit: Payer: Self-pay | Admitting: Family Medicine

## 2017-08-05 MED ORDER — SERTRALINE HCL 100 MG PO TABS: 200.0000 mg | ORAL_TABLET | Freq: Every day | ORAL | 2 refills | Status: DC

## 2017-08-05 NOTE — Telephone Encounter (Signed)
Medication was refilled. Pt was called and made aware via voice mail.

## 2017-09-27 NOTE — Progress Notes (Signed)
William Cummings [facility] was seen today in the movement disorders clinic for neurologic consultation at the request of [name], MD.  The consultation is for the evaluation of Parkinsons disease.    This patient is accompanied in the office by his sister who supplements the history.  Pt states that he was dx 1 year ago in [place] but sx's started about a year prior to that.  First sx was tremor in the R hand and now tremor in the R foot.  No fam hx of PD.  Started on carbidopa/levodopa 25/100 about a year ago in [place] and it helped.  Moved to [place] last Aug, but hasn't had neurologist since moved here.  Currently takes carbidopa/levodopa 25/100 after breakfast after breakfast at 9:30am/after lunch at 2pm/after dinner at 6:45 and bedtime 11:00pm.  He can tell when the medication wears off.  His sister reports that he lives in independent assisted living and they have noticed that he has too many pills left at the end of the month.  At one point, they realized he was only taking one levodopa per day, but they do think he is taking more than that currently.  12/09/15 update:  The patient follows up today.  I have not seen him since his March visit, as he canceled his June visit at the same day as the visit.  He is supposed to be on carbidopa/levodopa 25/100, one tablet at 7 AM/11 AM/3 PM/7 PM and last visit I added carbidopa/levodopa 50/200 at bedtime; however he states that when he added the bedtime CR he dropped one tablet of the IR so he is only taking three of those throughout the day.   Sister came in part way through the visit and states that he just couldn't remember to take it four times a day.   Lives at [facility] but he cannot afford for them to do meds.  They do meals for him.    I also referred him for home physical therapies.   He cannot remember how long he did this.  He doesn't drive.     No cramping of the feet and toes at night.    Wearing off:  Yes.    How long before next dose:  n/a Falls:    No. N/V:  No. Hallucinations:  No.  visual distortions: No. Lightheaded:  No.  Syncope: No. Dyskinesia:  No.   06/17/16 update:  Patient follows up today.  His sister is apparently here but she doesn't come back to the room today.   I have not seen him in about 6 months.  He is prescribed carbidopa/levodopa 25/100, 2 tablets in the morning, one in the afternoon and one in the evening in addition to carbidopa/levodopa 50/200 at bedtime.  He states that he is doing pretty well.  No falls.  Not exercising.  Memory has been fair - "I still remember where my room and the dining room is."  No visual distortions or hallucinations.  No lightheadedness.    06/09/17 update: Patient is seen today in follow-up.  He is accompanied by his sister who supplements the history.  I have not seen him in 1 year.  He no showed his August appointment and canceled the November appointment.  He reports that he still takes carbidopa/levodopa 25/100, 2 tablets in the morning (8-9am), 1 in the afternoon (misses that one 50% of time at least ) and 1 in the evening (5-5:30) as well as carbidopa/levodopa 50/200 at bedtime.  His sister called him to remind him to take his medication.  Despite that, his sister says he doesn't like to take the medication.  He calls it a "nuisance."  He has no SE with the medication. He was just seen by his primary care physician on June 02, 2017.  I have reviewed those records.  No falls.  No hallucinations.    09/29/17 update: Patient is seen today in follow-up for Parkinson's disease.  He is accompanied by his sister who supplements the history.  Patient is on carbidopa/levodopa 25/100, 2/1/1 and carbidopa/levodopa 50/200 at bedtime. Not generally taking middle of day dosage.  We set an alarm for him last visit but it goes off and he still doesn't take it.  Sister states that he quit taking medication for a week.  The assisted living noted he had not been coming to communal area to eat. When his sister  came to visit, she noted that the pill box she set up for him the previous week still had all the pills in it.  Pt reports today that he doesn't remember not taking meds.  Friends noted more tremor than usual.  Pt had one fall since our last visit and he fell on his hands/knees.  His head hit the front door.  He was evaluated at [facility] long. CT brain wasn't done but no MS change since then.  The records that were made available to me were reviewed.  Pt denies lightheadedness, near syncope.  No hallucinations.  Mood has been fair.  The records that were made available to me were reviewed.  Saw PCP on 08/03/17.  No changes to meds made.    PREVIOUS MEDICATIONS: Sinemet  ALLERGIES:  No Known Allergies  CURRENT MEDICATIONS:  Outpatient Encounter Medications as of 09/29/2017  Medication Sig  . carbidopa-levodopa (SINEMET CR) 50-200 MG tablet Take 1 tablet by mouth at bedtime.  . carbidopa-levodopa (SINEMET IR) 25-100 MG tablet 2 in the AM, 1 in the afternoon, 1 in the evening  . sertraline (ZOLOFT) 100 MG tablet Take 2 tablets (200 mg total) by mouth daily.   No facility-administered encounter medications on file as of 09/29/2017.     PAST MEDICAL HISTORY:   Past Medical History:  Diagnosis Date  . Depression   . Parkinson's disease (HCC)     PAST SURGICAL HISTORY:  No past surgical history on file.  SOCIAL HISTORY:   Social History   Socioeconomic History  . Marital status: Divorced    Spouse name: Not on file  . Number of children: Not on file  . Years of education: Not on file  . Highest education level: Not on file  Occupational History  . Occupation: Retired    Comment: Atty.  Social Needs  . Financial resource strain: Not on file  . Food insecurity:    Worry: Not on file    Inability: Not on file  . Transportation needs:    Medical: Not on file    Non-medical: Not on file  Tobacco Use  . Smoking status: Never Smoker  . Smokeless tobacco: Never Used  Substance and Sexual  Activity  . Alcohol use: Yes    Alcohol/week: 0.0 oz    Comment: glass of wine once every couple months  . Drug use: No  . Sexual activity: Never  Lifestyle  . Physical activity:    Days per week: Not on file    Minutes per session: Not on file  . Stress: Not on file  Relationships  . Social connections:    Talks on phone: Not on file    Gets together: Not on file    Attends religious service: Not on file    Active member of club or organization: Not on file    Attends meetings of clubs or organizations: Not on file    Relationship status: Not on file  . Intimate partner violence:    Fear of current or ex partner: Not on file    Emotionally abused: Not on file    Physically abused: Not on file    Forced sexual activity: Not on file  Other Topics Concern  . Not on file  Social History Narrative  . Not on file    FAMILY HISTORY:   Family Status  Relation Name Status  . Mother  Deceased       HTN  . Father  Deceased       HTN  . Brother [employer]       x 6  . Sister Randa Evens Deceased       x1  . Son  Alive  . Sister [employer]  . Sister Aletha Halim Alive  . Brother [employer]  . Brother [employer]    ROS:  A complete 10 system review of systems was obtained and was unremarkable apart from what is mentioned above.  PHYSICAL EXAMINATION:    VITALS:   Vitals:   09/29/17 1503  BP: 118/78  Pulse: 68  SpO2: 97%  Weight: 146 lb (66.2 kg)  Height: 5\' 7"  (1.702 m)    GEN:  The patient appears stated age and is in NAD. HEENT:  Normocephalic, atraumatic.  The mucous membranes are moist. The superficial temporal arteries are without ropiness or tenderness. CV:  RRR Lungs:  CTAB Neck/HEME:  There are no carotid bruits bilaterally.  Neurological examination:  Orientation:  Montreal Cognitive Assessment  06/09/2017 06/17/2016 07/10/2015  Visuospatial/ Executive (0/5) 5 4 5   Naming (0/3) 3 3 3   Attention: Read list of digits (0/2) 2 2 2   Attention: Read list  of letters (0/1) 1 1 1   Attention: Serial 7 subtraction starting at 100 (0/3) 3 3 3   Language: Repeat phrase (0/2) 2 2 2   Language : Fluency (0/1) 0 1 1  Abstraction (0/2) 2 2 2   Delayed Recall (0/5) 0 0 0  Orientation (0/6) 3 3 4   Total 21 21 23   Adjusted Score (based on education) - - 23   Neurological examination:  Orientation: The patient is alert and oriented x3. Cranial nerves: There is good facial symmetry. The speech is fluent and clear. Soft palate rises symmetrically and there is no tongue deviation. Hearing is intact to conversational tone. Sensation: Sensation is intact to light touch throughout Motor: Strength is 5/5 in the bilateral upper and lower extremities.   Shoulder shrug is equal and symmetric.  There is no pronator drift.  Movement examination: Tone: There is mild increased tone in the bilateral upper extremities. Abnormal movements: Right upper extremity resting tremor Coordination:  There is  decremation with RAM's, with any form of RAMS, including alternating supination and pronation of the forearm, hand opening and closing, finger taps, heel taps and toe taps.  The right is worse than the left.  Some of the slowness of rapid alternating movements is due to apraxia. Gait and Station: The patient pushes off of the chair to get out of it.  He actually walks fairly well down the hall.   Labs:  Chemistry      Component Value Date/Time   NA 136 06/15/2017 1206   K 3.9 06/15/2017 1206   CL 104 06/15/2017 1206   CO2 22 06/15/2017 1206   BUN 22 (H) 06/15/2017 1206   CREATININE 0.91 06/15/2017 1206      Component Value Date/Time   CALCIUM 9.2 06/15/2017 1206     No results found for: TSH Lab Results  Component Value Date   WBC 10.4 06/15/2017   HGB 14.6 06/15/2017   HCT 41.5 06/15/2017   MCV 89.6 06/15/2017   PLT 179 06/15/2017     ASSESSMENT/PLAN:  1.  Idiopathic Parkinson's disease.  The patient has tremor, bradykinesia, rigidity and mild  postural instability.  -Biggest issue is compliance with medication.  He really should have medication monitoring, but [facility] and cannot afford that aspect of the program.  Therefore, he usually misses the middle of the day dose.  He is supposed to be on carbidopa/levodopa 25/100, 2/1/1.  We will try to change this to carbidopa/levodopa 25/100, 2/1/2, knowing that the middle of the day dosages that he gets missed.  He can continue carbidopa/levodopa 50/200 at bedtime.    -Safety in the home discussed.  -Invited to Parkinson's symposium. 2.  Parkinson's dementia  -Even though the patient does well on his MoCA, I suspect that he does have Parkinson's dementia and that he does well in testing because he is highly educated.  I am concerned that he will not be able to live alone in independent living without much longer.  However, cost is a big issue for them.  His Moca has not declined over the last year.   3.  Follow up is anticipated in the next few months, sooner should new neurologic issues arise.  Much greater than 50% of this visit was spent in counseling and coordinating care.  Total face to face time:  25 min

## 2017-09-29 ENCOUNTER — Telehealth: Payer: Self-pay | Admitting: Psychology

## 2017-09-29 ENCOUNTER — Ambulatory Visit (INDEPENDENT_AMBULATORY_CARE_PROVIDER_SITE_OTHER): Payer: Medicare HMO | Admitting: Neurology

## 2017-09-29 ENCOUNTER — Encounter: Payer: Self-pay | Admitting: Neurology

## 2017-09-29 ENCOUNTER — Telehealth: Payer: Self-pay | Admitting: Neurology

## 2017-09-29 VITALS — BP 118/78 | HR 68 | Ht 67.0 in | Wt 146.0 lb

## 2017-09-29 DIAGNOSIS — G20 Parkinson's disease: Secondary | ICD-10-CM

## 2017-09-29 DIAGNOSIS — F0280 Dementia in other diseases classified elsewhere without behavioral disturbance: Secondary | ICD-10-CM

## 2017-09-29 NOTE — Telephone Encounter (Signed)
Returning your call. °

## 2017-09-29 NOTE — Telephone Encounter (Signed)
Telephone call to patient's sister today as I noticed the patient is coming in this afternoon and I will be unavailable to meet with him.   The purpose of my call was to see how that reminding system has worked for the afternoon dosing of this medication and to follow-up on any resources/needs that they have identified. I never received the part of the SCAT application that she was going to complete and bring by the office.  I will be more than happy to leave information for them or contact them when I am back in the office if more needs are identified today.

## 2017-09-29 NOTE — Patient Instructions (Addendum)
Increase your carbidopa/levodopa 25/100 to 2 tablets in the AM, 1 in the afternoon, 2 in the evening (prior to dinner)  Exercise!  Registration is OPEN!    Third Annual Parkinson's Education Symposium   To register: [email or web address]      Search:  [employer]'s Symposium  Register EACH person attending individually Questions: Contact Link SnufferJessica Thomas, Alexander MtLCSW  [phone] or [email or web address] [email or web address]

## 2017-12-11 ENCOUNTER — Other Ambulatory Visit: Payer: Self-pay | Admitting: Neurology

## 2018-01-14 ENCOUNTER — Other Ambulatory Visit: Payer: Self-pay | Admitting: Neurology

## 2018-02-03 NOTE — Progress Notes (Signed)
William Cummings [facility] was seen today in the movement disorders clinic for neurologic consultation at the request of [name], MD.  The consultation is for the evaluation of Parkinsons disease.    This patient is accompanied in the office by his sister who supplements the history.  Pt states that he was dx 1 year ago in [place] but sx's started about a year prior to that.  First sx was tremor in the R hand and now tremor in the R foot.  No fam hx of PD.  Started on carbidopa/levodopa 25/100 about a year ago in [place] and it helped.  Moved to [place] last Aug, but hasn't had neurologist since moved here.  Currently takes carbidopa/levodopa 25/100 after breakfast after breakfast at 9:30am/after lunch at 2pm/after dinner at 6:45 and bedtime 11:00pm.  He can tell when the medication wears off.  His sister reports that he lives in independent assisted living and they have noticed that he has too many pills left at the end of the month.  At one point, they realized he was only taking one levodopa per day, but they do think he is taking more than that currently.  12/09/15 update:  The patient follows up today.  I have not seen him since his March visit, as he canceled his June visit at the same day as the visit.  He is supposed to be on carbidopa/levodopa 25/100, one tablet at 7 AM/11 AM/3 PM/7 PM and last visit I added carbidopa/levodopa 50/200 at bedtime; however he states that when he added the bedtime CR he dropped one tablet of the IR so he is only taking three of those throughout the day.   Sister came in part way through the visit and states that he just couldn't remember to take it four times a day.   Lives at [facility] but he cannot afford for them to do meds.  They do meals for him.    I also referred him for home physical therapies.   He cannot remember how long he did this.  He doesn't drive.     No cramping of the feet and toes at night.    Wearing off:  Yes.    How long before next dose:  n/a Falls:    No. N/V:  No. Hallucinations:  No.  visual distortions: No. Lightheaded:  No.  Syncope: No. Dyskinesia:  No.   06/17/16 update:  Patient follows up today.  His sister is apparently here but she doesn't come back to the room today.   I have not seen him in about 6 months.  He is prescribed carbidopa/levodopa 25/100, 2 tablets in the morning, one in the afternoon and one in the evening in addition to carbidopa/levodopa 50/200 at bedtime.  He states that he is doing pretty well.  No falls.  Not exercising.  Memory has been fair - "I still remember where my room and the dining room is."  No visual distortions or hallucinations.  No lightheadedness.    06/09/17 update: Patient is seen today in follow-up.  He is accompanied by his sister who supplements the history.  I have not seen him in 1 year.  He no showed his August appointment and canceled the November appointment.  He reports that he still takes carbidopa/levodopa 25/100, 2 tablets in the morning (8-9am), 1 in the afternoon (misses that one 50% of time at least ) and 1 in the evening (5-5:30) as well as carbidopa/levodopa 50/200 at bedtime.  His sister called him to remind him to take his medication.  Despite that, his sister says he doesn't like to take the medication.  He calls it a "nuisance."  He has no SE with the medication. He was just seen by his primary care physician on June 02, 2017.  I have reviewed those records.  No falls.  No hallucinations.    09/29/17 update: Patient is seen today in follow-up for Parkinson's disease.  He is accompanied by his sister who supplements the history.  Patient is on carbidopa/levodopa 25/100, 2/1/1 and carbidopa/levodopa 50/200 at bedtime. Not generally taking middle of day dosage.  We set an alarm for him last visit but it goes off and he still doesn't take it.  Sister states that he quit taking medication for a week.  The assisted living noted he had not been coming to communal area to eat. When his sister  came to visit, she noted that the pill box she set up for him the previous week still had all the pills in it.  Pt reports today that he doesn't remember not taking meds.  Friends noted more tremor than usual.  Pt had one fall since our last visit and he fell on his hands/knees.  His head hit the front door.  He was evaluated at [facility]. CT brain wasn't done but no MS change since then.  The records that were made available to me were reviewed.  Pt denies lightheadedness, near syncope.  No hallucinations.  Mood has been fair.  The records that were made available to me were reviewed.  Saw PCP on 08/03/17.  No changes to meds made.    02/07/18 update: Patient is seen today in follow-up for Parkinson's disease, accompanied by his sister who supplements history.  He is supposed to be on carprofen/levodopa 25/100 that was increased last visit to 2/1/2.  He is sometimes taking the middle of the day dose - takes it perhaps 50% of the time.  Sister states that "one week he took all his medication and I was so surprised."  He is also on carbidopa/levodopa 50/200 at bedtime.  No falls.  No hallucinations. Doesn't exercise.  Does socialize with friends.  PREVIOUS MEDICATIONS: Sinemet  ALLERGIES:  No Known Allergies  CURRENT MEDICATIONS:  Outpatient Encounter Medications as of 02/07/2018  Medication Sig  . carbidopa-levodopa (SINEMET CR) 50-200 MG tablet TAKE 1 TABLET BY MOUTH EVERYDAY AT BEDTIME  . carbidopa-levodopa (SINEMET IR) 25-100 MG tablet TAKE 2 TABLETS IN THE MORNING, 1 TABLET IN THE AFTERNOON AND 2 TABLETS IN THE EVENING  . sertraline (ZOLOFT) 100 MG tablet Take 2 tablets (200 mg total) by mouth daily.   No facility-administered encounter medications on file as of 02/07/2018.     PAST MEDICAL HISTORY:   Past Medical History:  Diagnosis Date  . Depression   . Parkinson's disease (HCC)     PAST SURGICAL HISTORY:  History reviewed. No pertinent surgical history.  SOCIAL HISTORY:   Social  History   Socioeconomic History  . Marital status: Divorced    Spouse name: Not on file  . Number of children: Not on file  . Years of education: Not on file  . Highest education level: Not on file  Occupational History  . Occupation: Retired    Comment: Atty.  Social Needs  . Financial resource strain: Not on file  . Food insecurity:    Worry: Not on file    Inability: Not on file  . Transportation  needs:    Medical: Not on file    Non-medical: Not on file  Tobacco Use  . Smoking status: Never Smoker  . Smokeless tobacco: Never Used  Substance and Sexual Activity  . Alcohol use: Yes    Alcohol/week: 0.0 standard drinks    Comment: glass of wine once every couple months  . Drug use: No  . Sexual activity: Never  Lifestyle  . Physical activity:    Days per week: Not on file    Minutes per session: Not on file  . Stress: Not on file  Relationships  . Social connections:    Talks on phone: Not on file    Gets together: Not on file    Attends religious service: Not on file    Active member of club or organization: Not on file    Attends meetings of clubs or organizations: Not on file    Relationship status: Not on file  . Intimate partner violence:    Fear of current or ex partner: Not on file    Emotionally abused: Not on file    Physically abused: Not on file    Forced sexual activity: Not on file  Other Topics Concern  . Not on file  Social History Narrative  . Not on file    FAMILY HISTORY:   Family Status  Relation Name Status  . Mother  Deceased       HTN  . Father  Deceased       HTN  . Brother [employer]       x 6  . Sister Randa Evens Deceased       x1  . Son  Alive  . Sister [employer]  . Sister Aletha Halim Alive  . Brother [employer]  . Brother [employer]    ROS: Review of Systems  Unable to perform ROS: Dementia    PHYSICAL EXAMINATION:    VITALS:   Vitals:   02/07/18 1521  BP: (!) 148/84  Pulse: 92  SpO2: 96%  Weight: 154 lb  (69.9 kg)  Height: 5\' 7"  (1.702 m)    GEN:  The patient appears stated age and is in NAD. HEENT:  Normocephalic, atraumatic.  The mucous membranes are moist. The superficial temporal arteries are without ropiness or tenderness. CV:  RRR Lungs:  CTAB Neck/HEME:  There are no carotid bruits bilaterally.  Neurological examination:  Orientation:  Montreal Cognitive Assessment  06/09/2017 06/17/2016 07/10/2015  Visuospatial/ Executive (0/5) 5 4 5   Naming (0/3) 3 3 3   Attention: Read list of digits (0/2) 2 2 2   Attention: Read list of letters (0/1) 1 1 1   Attention: Serial 7 subtraction starting at 100 (0/3) 3 3 3   Language: Repeat phrase (0/2) 2 2 2   Language : Fluency (0/1) 0 1 1  Abstraction (0/2) 2 2 2   Delayed Recall (0/5) 0 0 0  Orientation (0/6) 3 3 4   Total 21 21 23   Adjusted Score (based on education) - - 23   Neurological examination:  Orientation: The patient is alert and oriented x3. Cranial nerves: There is good facial symmetry. The speech is fluent and clear. Soft palate rises symmetrically and there is no tongue deviation. Hearing is intact to conversational tone. Sensation: Sensation is intact to light touch throughout Motor: Strength is 5/5 in the bilateral upper and lower extremities.   Shoulder shrug is equal and symmetric.  There is no pronator drift.  Movement examination: Tone: There is mild increased tone  in the bilateral upper extremities. Abnormal movements: Right upper extremity resting tremor Coordination:  There is  decremation with RAM's, with any form of RAMS, including alternating supination and pronation of the forearm, hand opening and closing, finger taps, heel taps and toe taps.  The right is worse than the left.  Some of the slowness of rapid alternating movements is due to apraxia. Gait and Station: The patient pushes off of the chair to get out of it.  He actually walks fairly well down the hall.   Labs:   Chemistry      Component Value  Date/Time   NA 136 06/15/2017 1206   K 3.9 06/15/2017 1206   CL 104 06/15/2017 1206   CO2 22 06/15/2017 1206   BUN 22 (H) 06/15/2017 1206   CREATININE 0.91 06/15/2017 1206      Component Value Date/Time   CALCIUM 9.2 06/15/2017 1206     No results found for: TSH Lab Results  Component Value Date   WBC 10.4 06/15/2017   HGB 14.6 06/15/2017   HCT 41.5 06/15/2017   MCV 89.6 06/15/2017   PLT 179 06/15/2017     ASSESSMENT/PLAN:  1.  Idiopathic Parkinson's disease.  The patient has tremor, bradykinesia, rigidity and mild postural instability.  -Biggest issue is compliance with medication.  He really should have medication monitoring, but lives at [facility] and cannot afford that aspect of the program.  Therefore, he usually misses the middle of the day dose.  His family has set up reminder systems up for him, but he just has trouble without middle of the day dose.  I told him to try to remember to take it.  Will continue carbidopa/levodopa 25/100, 2 tablets in the morning, when the afternoon and 2 in the evening. 2.  Parkinson's dementia  -Discussed this diagnosis again today.  Discussed importance of mental and physical exercises. 3.  Follow-up in 5 to 6 months, sooner should new neurologic issues arise.

## 2018-02-07 ENCOUNTER — Encounter: Payer: Self-pay | Admitting: Neurology

## 2018-02-07 ENCOUNTER — Ambulatory Visit: Payer: Medicare HMO | Admitting: Neurology

## 2018-02-07 VITALS — BP 148/84 | HR 92 | Ht 67.0 in | Wt 154.0 lb

## 2018-02-07 DIAGNOSIS — G20 Parkinson's disease: Secondary | ICD-10-CM | POA: Diagnosis not present

## 2018-02-07 DIAGNOSIS — F0280 Dementia in other diseases classified elsewhere without behavioral disturbance: Secondary | ICD-10-CM

## 2018-02-17 ENCOUNTER — Telehealth: Payer: Self-pay | Admitting: Neurology

## 2018-02-17 NOTE — Telephone Encounter (Signed)
Received vm from [place] with [facility]. She asked for call back on [phone] to confirm start of care date 02/21/18. I called - I don't see where we ordered home health. They sent over a request for PT/OT after patient released from SNF that Dr. Arbutus Leas signed. Verbal okay given.

## 2018-02-24 ENCOUNTER — Telehealth: Payer: Self-pay | Admitting: Neurology

## 2018-02-24 NOTE — Telephone Encounter (Signed)
William Cummings with [facility] called [facility] letting us know patient will start services on 02/27/18.

## 2018-05-07 ENCOUNTER — Other Ambulatory Visit: Payer: Self-pay | Admitting: Family Medicine

## 2018-05-08 NOTE — Telephone Encounter (Signed)
Pt was last seen on 08/03/2017 and last refill was done on 08/05/2017 for 180 tablets with 2 refills, please advise if ok to refill

## 2018-05-19 ENCOUNTER — Other Ambulatory Visit: Payer: Self-pay

## 2018-05-19 ENCOUNTER — Emergency Department (HOSPITAL_COMMUNITY): Payer: Medicare HMO

## 2018-05-19 ENCOUNTER — Encounter (HOSPITAL_COMMUNITY): Payer: Self-pay | Admitting: *Deleted

## 2018-05-19 ENCOUNTER — Emergency Department (HOSPITAL_COMMUNITY)
Admission: EM | Admit: 2018-05-19 | Discharge: 2018-05-19 | Disposition: A | Discharge status: Home / Self Care | Payer: Medicare HMO | Attending: Emergency Medicine | Admitting: Emergency Medicine

## 2018-05-19 DIAGNOSIS — Y92199 Unspecified place in other specified residential institution as the place of occurrence of the external cause: Secondary | ICD-10-CM | POA: Insufficient documentation

## 2018-05-19 DIAGNOSIS — W19XXXA Unspecified fall, initial encounter: Secondary | ICD-10-CM

## 2018-05-19 DIAGNOSIS — G20 Parkinson's disease: Secondary | ICD-10-CM | POA: Insufficient documentation

## 2018-05-19 DIAGNOSIS — Z79899 Other long term (current) drug therapy: Secondary | ICD-10-CM | POA: Diagnosis not present

## 2018-05-19 DIAGNOSIS — Y939 Activity, unspecified: Secondary | ICD-10-CM | POA: Diagnosis not present

## 2018-05-19 DIAGNOSIS — S0101XA Laceration without foreign body of scalp, initial encounter: Secondary | ICD-10-CM | POA: Diagnosis not present

## 2018-05-19 DIAGNOSIS — Z23 Encounter for immunization: Secondary | ICD-10-CM | POA: Insufficient documentation

## 2018-05-19 DIAGNOSIS — S0990XA Unspecified injury of head, initial encounter: Secondary | ICD-10-CM | POA: Diagnosis present

## 2018-05-19 DIAGNOSIS — Y999 Unspecified external cause status: Secondary | ICD-10-CM | POA: Diagnosis not present

## 2018-05-19 DIAGNOSIS — W0110XA Fall on same level from slipping, tripping and stumbling with subsequent striking against unspecified object, initial encounter: Secondary | ICD-10-CM | POA: Insufficient documentation

## 2018-05-19 MED ORDER — LIDOCAINE-EPINEPHRINE (PF) 2 %-1:200000 IJ SOLN
20.0000 mL | Freq: Once | INTRAMUSCULAR | Status: AC
  Administered 2018-05-19: 20 mL
  Filled 2018-05-19: qty 20

## 2018-05-19 MED ORDER — TETANUS-DIPHTH-ACELL PERTUSSIS 5-2.5-18.5 LF-MCG/0.5 IM SUSP
0.5000 mL | Freq: Once | INTRAMUSCULAR | Status: AC
  Administered 2018-05-19: 0.5 mL via INTRAMUSCULAR
  Filled 2018-05-19: qty 0.5

## 2018-05-19 NOTE — ED Notes (Signed)
Bed: WA20 Expected date:  Expected time:  Means of arrival:  Comments: EMS 78 yo male from facility-fall-hit back of head-hematoma-no blood thinners

## 2018-05-19 NOTE — Discharge Instructions (Signed)
I have given you a paper prescription for a walker. As we discussed your insurance may require your primary care doctor to fill out additional paper work.

## 2018-05-19 NOTE — ED Notes (Signed)
Patient transported to CT 

## 2018-05-19 NOTE — ED Triage Notes (Signed)
Pt with history of parkinson having frequent falls. Fell yesterday and was not evaluated, no injury reported. Pt fell again today and hit the back of his head. EMS reported laceration to back of head and it is currently wrapped. Pt A&Ox4 with delayed responses normal per baseline. Pt with no LOC per EMS.

## 2018-05-19 NOTE — ED Provider Notes (Signed)
[facility]-EMERGENCY DEPT Provider Note   CSN: [id number] Arrival date & time: 05/19/18  2009     History   Chief Complaint Chief Complaint  Patient presents with  . Fall    HPI William Cummings is a 78 y.o. male with a past medical history of parkinson's disease, depression, who presents today for evaluation of a ground-level fall.   He reports that he was walking when he tripped over his own feet causing him to fall and strike his head on the ground.  He has had multiple falls over the past two days.  He lives in independent living at a facility.  He reports that he hit his head when he fell.  He reports that his falls are from tripping over his feet.  And underlying instability from his Parkinson's disease.  He denies any loss of consciousness, or prodrome symptoms.  No chest pain, shortness of breath, nausea vomiting or diarrhea.  No recent sickness or illness.  He denies any pain in his arms, legs, or hips.  His only reported pain is in his head on the back where he has a cut.  He is unsure when his last tetanus shot was.  His most recent fall happened shortly prior to arrival.  He does not use a cane or walker to help steady himself when he walks.  Patient, and his sister, report the patient frequently takes the back stairs instead of either the front stairs where there are more people, or the elevator as patient reports that this is quicker.    HPI  Past Medical History:  Diagnosis Date  . Depression   . Parkinson's disease [facility])     Patient Active Problem List   Diagnosis Date Noted  . Parkinson's disease (HCC) 08/03/2017  . Depression, recurrent (HCC) 08/03/2017    History reviewed. No pertinent surgical history.      Home Medications    Prior to Admission medications   Medication Sig Start Date End Date Taking? Authorizing Provider  carbidopa-levodopa (SINEMET CR) 50-200 MG tablet TAKE 1 TABLET BY MOUTH EVERYDAY AT BEDTIME Patient taking  differently: Take 1 tablet by mouth at bedtime.  12/12/17  Yes Tat, Octaviano Batty, DO  carbidopa-levodopa (SINEMET IR) 25-100 MG tablet TAKE 2 TABLETS IN THE MORNING, 1 TABLET IN THE AFTERNOON AND 2 TABLETS IN THE EVENING 01/16/18  Yes Tat, Octaviano Batty, DO  sertraline (ZOLOFT) 100 MG tablet TAKE 2 TABLETS BY MOUTH EVERY DAY 05/09/18  Yes Deeann Saint, MD    Family History Family History  Problem Relation Age of Onset  . Heart attack Father   . Cancer Sister   . Early death Sister   . Depression Brother     Social History Social History   Tobacco Use  . Smoking status: Never Smoker  . Smokeless tobacco: Never Used  Substance Use Topics  . Alcohol use: Yes    Alcohol/week: 0.0 standard drinks    Comment: glass of wine once every couple months  . Drug use: No     Allergies   Patient has no known allergies.   Review of Systems Review of Systems  Constitutional: Negative for chills and fever.  HENT: Negative for congestion, ear pain, nosebleeds, sinus pressure and sinus pain.   Eyes: Negative for pain and visual disturbance.  Respiratory: Negative for chest tightness and shortness of breath.   Cardiovascular: Negative for chest pain.  Gastrointestinal: Negative for abdominal pain, diarrhea, nausea and vomiting.  Musculoskeletal:  Negative for back pain, joint swelling, neck pain and neck stiffness.  Skin: Positive for wound (Posterior scalp). Negative for color change.  Neurological: Positive for tremors (Baseline per patient) and headaches (Posterior, when he hit his head). Negative for weakness (Strength is baseline per patient).  Psychiatric/Behavioral: Negative for confusion.  All other systems reviewed and are negative.    Physical Exam Updated Vital Signs BP (!) 159/86 (BP Location: Left Arm)   Pulse 92   Temp 99.4 F (37.4 C) (Oral)   Resp 16   Ht 5\' 9"  (1.753 m)   Wt 63.5 kg   SpO2 99%   BMI 20.67 kg/m   Physical Exam Vitals signs and nursing note reviewed.    Constitutional:      General: He is not in acute distress.    Appearance: He is well-developed.  HENT:     Head: Normocephalic.     Comments: 1 x 3 cm L shaped laceration on the posterior scalp.  There is a small area pulsatile blood flow from the wound.  This was easily controlled with pressure.      Right Ear: Tympanic membrane, ear canal and external ear normal.     Left Ear: Tympanic membrane, ear canal and external ear normal.     Nose: Nose normal.     Mouth/Throat:     Mouth: Mucous membranes are moist.  Eyes:     Extraocular Movements: Extraocular movements intact.     Conjunctiva/sclera: Conjunctivae normal.     Pupils: Pupils are equal, round, and reactive to light.  Neck:     Comments: Initially not tested range of motion due to c-collar in place.  After CT scan results c-collar was removed, he does not have any midline tenderness to palpation step-offs or deformities.  No pain with range of motion.  No paraspinal tenderness. Cardiovascular:     Rate and Rhythm: Normal rate and regular rhythm.     Pulses: Normal pulses.     Heart sounds: Normal heart sounds. No murmur.  Pulmonary:     Effort: Pulmonary effort is normal. No respiratory distress.     Breath sounds: Normal breath sounds.  Abdominal:     General: Abdomen is flat.     Palpations: Abdomen is soft.     Tenderness: There is no abdominal tenderness.  Musculoskeletal:     Right lower leg: No edema.     Left lower leg: No edema.     Comments: 5/5 strength in bilateral upper and lower extremities.  Grip strength, leg lift.  He is ambulatory with assistance without limp.  Skin:    General: Skin is warm and dry.  Neurological:     Mental Status: He is alert.     Comments: Tremor consistent with history of Parkinson's.  Patient states is at his baseline.  He is awake and alert, responses are slightly delayed however he is able to give coherent history, is making jokes with providers and his sister.      ED  Treatments / Results  Labs (all labs ordered are listed, but only abnormal results are displayed) Labs Reviewed - No data to display  EKG None  Radiology Ct Head Wo Contrast  Result Date: 05/19/2018 CLINICAL DATA:  Initial evaluation for acute trauma, recent fall. EXAM: CT HEAD WITHOUT CONTRAST CT CERVICAL SPINE WITHOUT CONTRAST TECHNIQUE: Multidetector CT imaging of the head and cervical spine was performed following the standard protocol without intravenous contrast. Multiplanar CT image reconstructions of the cervical  spine were also generated. COMPARISON:  None available. FINDINGS: CT HEAD FINDINGS Brain: Atrophy with chronic microvascular disease. No acute intracranial hemorrhage. No acute large vessel territory infarct. No mass lesion, midline shift or mass effect. No hydrocephalus. No extra-axial fluid collection. Vascular: No hyperdense vessel. Scattered vascular calcifications noted within the carotid siphons. Skull: Multifocal soft tissue contusions present at the right parietal and mid posterior scalp. No calvarial fracture. Sinuses/Orbits: Globes and orbital soft tissues within normal limits. Scattered mucosal thickening within the ethmoidal air cells and right maxillary sinus. Superimposed air-fluid level within the right maxillary sinus suggestive of acute sinusitis. Trace right mastoid effusion noted. Other: None. CT CERVICAL SPINE FINDINGS Alignment: Straightening of the normal cervical lordosis. No listhesis. Skull base and vertebrae: Skull base intact normal C1-2 articulations are preserved in the dens is intact. Osseous density at the tip of the dens demonstrates corticated margins, compatible with a chronic finding. Vertebral body heights maintained. No acute fracture. Soft tissues and spinal canal: Soft tissues of the neck demonstrate no acute finding no prevertebral soft tissue swelling. Spinal canal within normal limits. Disc levels: Degenerative disc bulging noted at C3-4 through  C6-7 with resultant mild spinal stenosis. Multilevel facet arthrosis throughout the cervical spine. Upper chest: Visualized upper chest demonstrates no acute finding. Other: None. IMPRESSION: CT BRAIN: 1. No acute intracranial abnormality. 2. Soft tissue contusions at the right parietal and mid posterior scalp. No calvarial fracture. 3. Age-related cerebral atrophy with chronic small vessel ischemic disease. CT CERVICAL SPINE: 1. No acute traumatic injury within the cervical spine. 2. Mild-to-moderate degenerative spondylolysis at C3-4 through C6-7. Electronically Signed   By: Rise Mu M.D.   On: 05/19/2018 21:37   Ct Cervical Spine Wo Contrast  Result Date: 05/19/2018 CLINICAL DATA:  Initial evaluation for acute trauma, recent fall. EXAM: CT HEAD WITHOUT CONTRAST CT CERVICAL SPINE WITHOUT CONTRAST TECHNIQUE: Multidetector CT imaging of the head and cervical spine was performed following the standard protocol without intravenous contrast. Multiplanar CT image reconstructions of the cervical spine were also generated. COMPARISON:  None available. FINDINGS: CT HEAD FINDINGS Brain: Atrophy with chronic microvascular disease. No acute intracranial hemorrhage. No acute large vessel territory infarct. No mass lesion, midline shift or mass effect. No hydrocephalus. No extra-axial fluid collection. Vascular: No hyperdense vessel. Scattered vascular calcifications noted within the carotid siphons. Skull: Multifocal soft tissue contusions present at the right parietal and mid posterior scalp. No calvarial fracture. Sinuses/Orbits: Globes and orbital soft tissues within normal limits. Scattered mucosal thickening within the ethmoidal air cells and right maxillary sinus. Superimposed air-fluid level within the right maxillary sinus suggestive of acute sinusitis. Trace right mastoid effusion noted. Other: None. CT CERVICAL SPINE FINDINGS Alignment: Straightening of the normal cervical lordosis. No listhesis.  Skull base and vertebrae: Skull base intact normal C1-2 articulations are preserved in the dens is intact. Osseous density at the tip of the dens demonstrates corticated margins, compatible with a chronic finding. Vertebral body heights maintained. No acute fracture. Soft tissues and spinal canal: Soft tissues of the neck demonstrate no acute finding no prevertebral soft tissue swelling. Spinal canal within normal limits. Disc levels: Degenerative disc bulging noted at C3-4 through C6-7 with resultant mild spinal stenosis. Multilevel facet arthrosis throughout the cervical spine. Upper chest: Visualized upper chest demonstrates no acute finding. Other: None. IMPRESSION: CT BRAIN: 1. No acute intracranial abnormality. 2. Soft tissue contusions at the right parietal and mid posterior scalp. No calvarial fracture. 3. Age-related cerebral atrophy with chronic small vessel ischemic  disease. CT CERVICAL SPINE: 1. No acute traumatic injury within the cervical spine. 2. Mild-to-moderate degenerative spondylolysis at C3-4 through C6-7. Electronically Signed   By: Rise Mu M.D.   On: 05/19/2018 21:37    Procedures .Marland KitchenLaceration Repair Date/Time: 05/20/2018 4:35 PM Performed by: Cristina Gong, PA-C Authorized by: Cristina Gong, PA-C   Consent:    Consent obtained:  Verbal   Consent given by:  Patient   Risks discussed:  Infection, need for additional repair, poor cosmetic result, pain, retained foreign body, vascular damage, poor wound healing and nerve damage   Alternatives discussed:  No treatment and referral (Alternative wound closures) Anesthesia (see MAR for exact dosages):    Anesthesia method:  Local infiltration   Local anesthetic:  Lidocaine 2% WITH epi Laceration details:    Location:  Scalp   Scalp location:  Occipital   Length (cm):  4 Repair type:    Repair type:  Simple Pre-procedure details:    Preparation:  Patient was prepped and draped in usual sterile fashion  and imaging obtained to evaluate for foreign bodies Exploration:    Hemostasis achieved with:  Epinephrine and direct pressure   Wound exploration: wound explored through full range of motion and entire depth of wound probed and visualized     Wound extent: no foreign bodies/material noted and no underlying fracture noted   Treatment:    Area cleansed with:  Saline   Amount of cleaning:  Standard   Irrigation solution:  Sterile saline   Irrigation volume:  1 liter Skin repair:    Repair method:  Staples   Number of staples:  5 Approximation:    Approximation:  Close Post-procedure details:    Dressing:  Open (no dressing)   Patient tolerance of procedure:  Tolerated well, no immediate complications   (including critical care time)  Medications Ordered in ED Medications  lidocaine-EPINEPHrine (XYLOCAINE W/EPI) 2 %-1:200000 (PF) injection 20 mL (20 mLs Infiltration Given 05/19/18 2142)  Tdap (BOOSTRIX) injection 0.5 mL (0.5 mLs Intramuscular Given 05/19/18 2317)     Initial Impression / Assessment and Plan / ED Course  I have reviewed the triage vital signs and the nursing notes.  Pertinent labs & imaging results that were available during my care of the patient were reviewed by me and considered in my medical decision making (see chart for details).    William Cummings is a 78 year old gentleman with a past medical history of Parkinson's disease who presents today for evaluation of multiple falls.  He struck his head during his fall today resulting in a 4 cm laceration on his posterior scalp.  He says that his falls are from tripping over his feet, denies any prodromal symptoms, suspect mechanical fall as patient appears unsteady on his feet with a history of Parkinson's disease.  CT scan of head and neck were obtained without any acute intracranial or cervical injuries noted, does show evidence of contusion/laceration.  CT scan does show maxillary sinus air-fluid levels, however  patient is not endorsing any symptoms consistent with sinusitis, therefore will not treat with antibiotics at this time.  Wound on posterior scalp was cleaned, and stapled please see procedure note.  Patient was unsure when his last tetanus shot was, therefore this was updated today.  I discussed with patient fall prevention mechanisms at home, including recommending that he take the elevator to avoid falling down steps.  I gave him a prescription for a rolling walker, however we did discuss that his  insurance may require preauthorization that would have to be completed by his primary care doctor.  According to his sister he receives PT and OT already.  Return precautions were discussed with patient who states their understanding.  At the time of discharge patient denied any unaddressed complaints or concerns.  Patient is agreeable for discharge home.  OTC pain medication as needed.     Final Clinical Impressions(s) / ED Diagnoses   Final diagnoses:  Fall, initial encounter  Laceration of scalp, initial encounter    ED Discharge Orders    None       Norman ClayHammond, Britanie Harshman W, PA-C 05/20/18 1642    Benjiman CorePickering, Nathan, MD 05/21/18 [phone]

## 2018-05-29 ENCOUNTER — Encounter: Payer: Self-pay | Admitting: Family Medicine

## 2018-05-29 ENCOUNTER — Ambulatory Visit (INDEPENDENT_AMBULATORY_CARE_PROVIDER_SITE_OTHER): Payer: Medicare HMO | Admitting: Family Medicine

## 2018-05-29 VITALS — BP 110/68 | HR 70 | Temp 97.7°F | Wt 147.0 lb

## 2018-05-29 DIAGNOSIS — Z4802 Encounter for removal of sutures: Secondary | ICD-10-CM

## 2018-05-29 NOTE — Progress Notes (Signed)
Subjective:    Patient ID: William Cummings, male    DOB: [birth year], 78 y.o.   MRN: [id number]  No chief complaint on file. Pt accompanied by his sister.  HPI Patient was seen today for staple removal.  Patient was seen in the ED on 05/19/2018 for fall tripping over his feet.  Pt does not recall the events, his sister provides the hx.  Pt has a history of Parkinson's disease.  Pt was not using a cane or walker to aid in ambulation, but he is now.  Pt using a rollator when outside of his apartment and a quad cane when inside his apt.  Past Medical History:  Diagnosis Date  . Depression   . Parkinson's disease (HCC)     No Known Allergies  ROS General: Denies fever, chills, night sweats, changes in weight, changes in appetite HEENT: Denies headaches, ear pain, changes in vision, rhinorrhea, sore throat CV: Denies CP, palpitations, SOB, orthopnea Pulm: Denies SOB, cough, wheezing GI: Denies abdominal pain, nausea, vomiting, diarrhea, constipation GU: Denies dysuria, hematuria, frequency, vaginal discharge Msk: Denies muscle cramps, joint pains Neuro: Denies weakness, numbness, tingling   +tremor Skin: Denies rashes, bruising  +scalp laceration Psych: Denies depression, anxiety, hallucinations    Objective:    Blood pressure 110/68, pulse 70, temperature 97.7 F (36.5 C), temperature source Oral, weight 147 lb (66.7 kg), SpO2 97 %.  Gen. Pleasant, well-nourished, in no distress, normal affect   Lungs: no accessory muscle use Cardiovascular: RRR,  no peripheral edema Neuro:  A&Ox3, CN II-XII intact, resting tremor, shuffled gait. Not using rollator or cane this visit Skin:  Warm, no lesions/ rash.  5 staples removed from scalp at posterior parietal area.  Wt Readings from Last 3 Encounters:  05/29/18 147 lb (66.7 kg)  05/19/18 140 lb (63.5 kg)  02/07/18 154 lb (69.9 kg)    Lab Results  Component Value Date   WBC 10.4 06/15/2017   HGB 14.6 06/15/2017   HCT 41.5  06/15/2017   PLT 179 06/15/2017   GLUCOSE 113 (H) 06/15/2017   NA 136 06/15/2017   K 3.9 06/15/2017   CL 104 06/15/2017   CREATININE 0.91 06/15/2017   BUN 22 (H) 06/15/2017   CO2 22 06/15/2017    Assessment/Plan:  Encounter for staple removal  -consent obtained.  Scalp cleaned.  5 staples removed. -discussed staple removal care -encouraged to use rollator consistently -f/u prn  Abbe Amsterdam, MD

## 2018-07-10 NOTE — Progress Notes (Deleted)
William Cummings [facility] was seen today in the movement disorders clinic for neurologic consultation at the request of Deeann Saint, MD.  The consultation is for the evaluation of Parkinsons disease.    This patient is accompanied in the office by his sister who supplements the history.  Pt states that he was dx 1 year ago in [place] but sx's started about a year prior to that.  First sx was tremor in the R hand and now tremor in the R foot.  No fam hx of PD.  Started on carbidopa/levodopa 25/100 about a year ago in [place] and it helped.  Moved to [place] last Aug, but hasn't had neurologist since moved here.  Currently takes carbidopa/levodopa 25/100 after breakfast after breakfast at 9:30am/after lunch at 2pm/after dinner at 6:45 and bedtime 11:00pm.  He can tell when the medication wears off.  His sister reports that he lives in independent assisted living and they have noticed that he has too many pills left at the end of the month.  At one point, they realized he was only taking one levodopa per day, but they do think he is taking more than that currently.  12/09/15 update:  The patient follows up today.  I have not seen him since his March visit, as he canceled his June visit at the same day as the visit.  He is supposed to be on carbidopa/levodopa 25/100, one tablet at 7 AM/11 AM/3 PM/7 PM and last visit I added carbidopa/levodopa 50/200 at bedtime; however he states that when he added the bedtime CR he dropped one tablet of the IR so he is only taking three of those throughout the day.   Sister came in part way through the visit and states that he just couldn't remember to take it four times a day.   Lives at [facility] but he cannot afford for them to do meds.  They do meals for him.    I also referred him for home physical therapies.   He cannot remember how long he did this.  He doesn't drive.     No cramping of the feet and toes at night.    Wearing off:  Yes.    How long before next dose:  n/a Falls:    No. N/V:  No. Hallucinations:  No.  visual distortions: No. Lightheaded:  No.  Syncope: No. Dyskinesia:  No.   06/17/16 update:  Patient follows up today.  His sister is apparently here but she doesn't come back to the room today.   I have not seen him in about 6 months.  He is prescribed carbidopa/levodopa 25/100, 2 tablets in the morning, one in the afternoon and one in the evening in addition to carbidopa/levodopa 50/200 at bedtime.  He states that he is doing pretty well.  No falls.  Not exercising.  Memory has been fair - "I still remember where my room and the dining room is."  No visual distortions or hallucinations.  No lightheadedness.    06/09/17 update: Patient is seen today in follow-up.  He is accompanied by his sister who supplements the history.  I have not seen him in 1 year.  He no showed his August appointment and canceled the November appointment.  He reports that he still takes carbidopa/levodopa 25/100, 2 tablets in the morning (8-9am), 1 in the afternoon (misses that one 50% of time at least ) and 1 in the evening (5-5:30) as well as carbidopa/levodopa 50/200 at bedtime.  His sister called him to remind him to take his medication.  Despite that, his sister says he doesn't like to take the medication.  He calls it a "nuisance."  He has no SE with the medication. He was just seen by his primary care physician on June 02, 2017.  I have reviewed those records.  No falls.  No hallucinations.    09/29/17 update: Patient is seen today in follow-up for Parkinson's disease.  He is accompanied by his sister who supplements the history.  Patient is on carbidopa/levodopa 25/100, 2/1/1 and carbidopa/levodopa 50/200 at bedtime. Not generally taking middle of day dosage.  We set an alarm for him last visit but it goes off and he still doesn't take it.  Sister states that he quit taking medication for a week.  The assisted living noted he had not been coming to communal area to eat. When his sister  came to visit, she noted that the pill box she set up for him the previous week still had all the pills in it.  Pt reports today that he doesn't remember not taking meds.  Friends noted more tremor than usual.  Pt had one fall since our last visit and he fell on his hands/knees.  His head hit the front door.  He was evaluated at [facility] long. CT brain wasn't done but no MS change since then.  The records that were made available to me were reviewed.  Pt denies lightheadedness, near syncope.  No hallucinations.  Mood has been fair.  The records that were made available to me were reviewed.  Saw PCP on 08/03/17.  No changes to meds made.    02/07/18 update: Patient is seen today in follow-up for Parkinson's disease, accompanied by his sister who supplements history.  He is supposed to be on carprofen/levodopa 25/100 that was increased last visit to 2/1/2.  He is sometimes taking the middle of the day dose - takes it perhaps 50% of the time.  Sister states that "one week he took all his medication and I was so surprised."  He is also on carbidopa/levodopa 50/200 at bedtime.  No falls.  No hallucinations. Doesn't exercise.  Does socialize with friends.  07/11/18 update: Patient is seen today in follow-up for Parkinson's disease, accompanied by his sister who supplements history.  Patient is supposed to be on carbidopa/levodopa 25/100, 2/1/2, but often misses the middle of the day dosage.  He has no assistance with med management, despite many talks.  He is on carbidopa/levodopa 50/200 at bedtime.  He does not exercise.  No hallucinations.  I have had the opportunity to review records since last visit.  He was in the emergency room on May 19, 2018 after a fall resulting in a 4 cm laceration on his scalp.  He had to have staples.  CT of the brain was done on January 24 because of this and was nonacute.  PREVIOUS MEDICATIONS: Sinemet  ALLERGIES:  No Known Allergies  CURRENT MEDICATIONS:  Outpatient Encounter  Medications as of 07/11/2018  Medication Sig   carbidopa-levodopa (SINEMET CR) 50-200 MG tablet TAKE 1 TABLET BY MOUTH EVERYDAY AT BEDTIME (Patient taking differently: Take 1 tablet by mouth at bedtime. )   carbidopa-levodopa (SINEMET IR) 25-100 MG tablet TAKE 2 TABLETS IN THE MORNING, 1 TABLET IN THE AFTERNOON AND 2 TABLETS IN THE EVENING   sertraline (ZOLOFT) 100 MG tablet TAKE 2 TABLETS BY MOUTH EVERY DAY   No facility-administered encounter medications on file as of  07/11/2018.     PAST MEDICAL HISTORY:   Past Medical History:  Diagnosis Date   Depression    Parkinson's disease (HCC)     PAST SURGICAL HISTORY:  No past surgical history on file.  SOCIAL HISTORY:   Social History   Socioeconomic History   Marital status: Divorced    Spouse name: Not on file   Number of children: Not on file   Years of education: Not on file   Highest education level: Not on file  Occupational History   Occupation: Retired    Comment: Atty.  Social Needs   Physicist, medical strain: Not on file   Food insecurity:    Worry: Not on file    Inability: Not on file   Transportation needs:    Medical: Not on file    Non-medical: Not on file  Tobacco Use   Smoking status: Never Smoker   Smokeless tobacco: Never Used  Substance and Sexual Activity   Alcohol use: Yes    Alcohol/week: 0.0 standard drinks    Comment: glass of wine once every couple months   Drug use: No   Sexual activity: Never  Lifestyle   Physical activity:    Days per week: Not on file    Minutes per session: Not on file   Stress: Not on file  Relationships   Social connections:    Talks on phone: Not on file    Gets together: Not on file    Attends religious service: Not on file    Active member of club or organization: Not on file    Attends meetings of clubs or organizations: Not on file    Relationship status: Not on file   Intimate partner violence:    Fear of current or ex partner: Not  on file    Emotionally abused: Not on file    Physically abused: Not on file    Forced sexual activity: Not on file  Other Topics Concern   Not on file  Social History Narrative   Not on file    FAMILY HISTORY:   Family Status  Relation Name Status   Mother  Deceased       HTN   Father  Deceased       HTN   Brother Daphine Deutscher Alive       x 6   Sister Randa Evens Deceased       x1   Son  Alive   Sister Quincy Alive   Sister Aletha Halim Alive   Brother Sunrise Alive   Brother Mizpah Alive    ROS: Review of Systems  Unable to perform ROS: Dementia    PHYSICAL EXAMINATION:    VITALS:   There were no vitals filed for this visit.  GEN:  The patient appears stated age and is in NAD. HEENT:  Normocephalic, atraumatic.  The mucous membranes are moist. The superficial temporal arteries are without ropiness or tenderness. CV:  RRR Lungs:  CTAB Neck/HEME:  There are no carotid bruits bilaterally.  Neurological examination:  Orientation:  Montreal Cognitive Assessment  06/09/2017 06/17/2016 07/10/2015  Visuospatial/ Executive (0/5) 5 4 5   Naming (0/3) 3 3 3   Attention: Read list of digits (0/2) 2 2 2   Attention: Read list of letters (0/1) 1 1 1   Attention: Serial 7 subtraction starting at 100 (0/3) 3 3 3   Language: Repeat phrase (0/2) 2 2 2   Language : Fluency (0/1) 0 1 1  Abstraction (0/2) 2 2 2  Delayed Recall (0/5) 0 0 0  Orientation (0/6) Total Adjusted Score (based on education) - - 23   Neurological examination:  Orientation: The patient is alert and oriented x3. Cranial nerves: There is good facial symmetry. The speech is fluent and clear. Soft palate rises symmetrically and there is no tongue deviation. Hearing is intact to conversational tone. Sensation: Sensation is intact to light touch throughout Motor: Strength is 5/5 in the bilateral upper and lower extremities.   Shoulder shrug is equal and symmetric.  There is no pronator  drift.  Movement examination: Tone: There is mild increased tone in the bilateral upper extremities. Abnormal movements: Right upper extremity resting tremor Coordination:  There is  decremation with RAM's, with any form of RAMS, including alternating supination and pronation of the forearm, hand opening and closing, finger taps, heel taps and toe taps.  The right is worse than the left.  Some of the slowness of rapid alternating movements is due to apraxia. Gait and Station: The patient pushes off of the chair to get out of it.  He actually walks fairly well down the hall.   Labs:   Chemistry      Component Value Date/Time   NA 136 06/15/2017 1206   K 3.9 06/15/2017 1206   CL 104 06/15/2017 1206   CO2 22 06/15/2017 1206   BUN 22 (H) 06/15/2017 1206   CREATININE 0.91 06/15/2017 1206      Component Value Date/Time   CALCIUM 9.2 06/15/2017 1206     No results found for: TSH Lab Results  Component Value Date   WBC 10.4 06/15/2017   HGB 14.6 06/15/2017   HCT 41.5 06/15/2017   MCV 89.6 06/15/2017   PLT 179 06/15/2017     ASSESSMENT/PLAN:  1.  Idiopathic Parkinson's disease.  The patient has tremor, bradykinesia, rigidity and mild postural instability.  -Biggest issue is compliance with medication.  He really should have medication monitoring, but lives at [facility] and cannot afford that aspect of the program.  Therefore, he usually misses the middle of the day dose.  His family has set up reminder systems up for him, but he just has trouble without middle of the day dose.  I told him to try to remember to take it.  Will continue carbidopa/levodopa 25/100, 2 tablets in the morning, when the afternoon and 2 in the evening. 2.  Parkinson's dementia  -Discussed this diagnosis again today.  Discussed importance of mental and physical exercises. 3.  Follow-up in 5 to 6 months, sooner should new neurologic issues arise.

## 2018-07-11 ENCOUNTER — Ambulatory Visit: Payer: Medicare HMO | Admitting: Neurology

## 2018-07-19 ENCOUNTER — Other Ambulatory Visit: Payer: Self-pay | Admitting: Neurology

## 2018-08-02 ENCOUNTER — Other Ambulatory Visit: Payer: Self-pay | Admitting: Neurology

## 2018-10-09 ENCOUNTER — Other Ambulatory Visit: Payer: Self-pay

## 2018-10-09 ENCOUNTER — Encounter: Payer: Self-pay | Admitting: Family Medicine

## 2018-10-09 ENCOUNTER — Ambulatory Visit: Payer: Self-pay

## 2018-10-09 ENCOUNTER — Ambulatory Visit (INDEPENDENT_AMBULATORY_CARE_PROVIDER_SITE_OTHER): Payer: Medicare HMO | Admitting: Family Medicine

## 2018-10-09 DIAGNOSIS — G20 Parkinson's disease: Secondary | ICD-10-CM

## 2018-10-09 DIAGNOSIS — F0280 Dementia in other diseases classified elsewhere without behavioral disturbance: Secondary | ICD-10-CM | POA: Diagnosis not present

## 2018-10-09 NOTE — Progress Notes (Signed)
Virtual Visit via Telephone Note  I connected with Keath [facility] on 10/09/18 at  3:00 PM EDT by telephone and verified that I am speaking with the correct person using two identifiers.   I discussed the limitations, risks, security and privacy concerns of performing an evaluation and management service by telephone and the availability of in person appointments. I also discussed with the patient that there may be a patient responsible charge related to this service. The patient expressed understanding and agreed to proceed.  Location patient: home Location provider: work or home office Participants present for the call: patient, provider Patient did not have a visit in the prior 7 days to address this/these issue(s).   History of Present Illness: Pt with a h/o dementia, Parkinson's dz, depression.  Pt called in regards to appointment made today for possible UTI.  Upon speaking with pt, he did not recall who this provider was.  Pt denied any issues.  Pt did not have any family members present to help answer questions.     Observations/Objective: Patient sounds cheerful, pleasantly confused, and well on the phone. I do not appreciate any SOB. Speech and thought processing are grossly intact. Patient reported vitals:  Assessment and Plan: Dementia associated with Parkinson's Disease -as pt denies any issues will reach out to his family members listed on DPR.  VM left as no answer. -will have family bring urine sample or pt to clinic if able to get in touch with them. -continue f/u with Neurology -continue carbidopa-levodopa and zoloft  Follow Up Instructions: F/u prn  I did not refer this patient for an OV in the next 24 hours for this/these issue(s).  I discussed the assessment and treatment plan with the patient. The patient was provided an opportunity to ask questions and all were answered. The patient agreed with the plan and demonstrated an understanding of the  instructions.   The patient was advised to call back or seek an in-person evaluation if the symptoms worsen or if the condition fails to improve as anticipated.  I provided 5 minutes of non-face-to-face time during this encounter.   [name], MD

## 2018-10-09 NOTE — Telephone Encounter (Signed)
  Incoming call  From Sister  Reporting that her brother has been demonstrating strange behavior lately.  Got out of the facility  where he lives .  Care taker had notice a puddle of urine on the floor.  Patient has not complained of Pain.  Unaware of urgency or frequency because Sister is not able to go visit.  Pt. Is inquring if Pt.  Can get a Rx for UTI?  Patient Sister request a return phone call from Dr.  Volanda Napoleon.   Answer Assessment - Initial Assessment Questions 1. SEVERITY: "How bad is the pain?"  (e.g., Scale 1-10; mild, moderate, or severe)   - MILD (1-3): complains slightly about urination hurting   - MODERATE (4-7): interferes with normal activities     - SEVERE (8-10): excruciating, unwilling or unable to urinate because of the pain     Does not complain of pain. 2. FREQUENCY: "How many times have you had painful urination today?"       3. PATTERN: "Is pain present every time you urinate or just sometimes?"       4. ONSET: "When did the painful urination start?"      *No Answer* 5. FEVER: "Do you have a fever?" If so, ask: "What is your temperature, how was it measured, and when did it start?"     unaware if  He has fever.  6. PAST UTI: "Have you had a urine infection before?" If so, ask: "When was the last time?" and "What happened that time?"      *No Answer* 7. CAUSE: "What do you think is causing the painful urination?"      *No Answer* 8. OTHER SYMPTOMS: "Do you have any other symptoms?" (e.g., flank pain, penile discharge, scrotal pain, blood in urine)     Denies  Protocols used: URINATION PAIN - MALE-A-AH

## 2018-10-11 NOTE — Telephone Encounter (Signed)
Spoke with Letta Median pt sister who is on pt DPR, states that pt needs a UA done due to pt being confused, states that she will pick up a urine cup on Friday from the office and drop the urine sample for testing.

## 2018-10-12 ENCOUNTER — Other Ambulatory Visit: Payer: Self-pay

## 2018-10-12 ENCOUNTER — Other Ambulatory Visit (INDEPENDENT_AMBULATORY_CARE_PROVIDER_SITE_OTHER): Payer: Medicare HMO

## 2018-10-12 ENCOUNTER — Telehealth: Payer: Self-pay

## 2018-10-12 DIAGNOSIS — R410 Disorientation, unspecified: Secondary | ICD-10-CM

## 2018-10-12 LAB — POCT URINALYSIS DIPSTICK
Bilirubin, UA: NEGATIVE
Blood, UA: NEGATIVE
Glucose, UA: NEGATIVE
Ketones, UA: NEGATIVE
Leukocytes, UA: NEGATIVE
Nitrite, UA: NEGATIVE
Protein, UA: NEGATIVE
Spec Grav, UA: 1.02 (ref 1.010–1.025)
Urobilinogen, UA: 0.2 E.U./dL
pH, UA: 6 (ref 5.0–8.0)

## 2018-10-12 NOTE — Telephone Encounter (Signed)
Copied from [place] [id number]. Topic: General - Other >> Oct 12, 2018 11:53 AM Yvette Rack wrote: Reason for CRM: Pt sister Letta Median requests to be contacted with the results once they are back. Cb# [phone]

## 2018-10-12 NOTE — Progress Notes (Signed)
.    Urine done

## 2018-10-13 ENCOUNTER — Telehealth: Payer: Self-pay

## 2018-10-13 NOTE — Telephone Encounter (Signed)
Results given to pt sister   Copied from [place] [id number]. Topic: General - Other >> Oct 13, 2018 10:25 AM Oneta Rack wrote: Sister inquiring about  urine results, please advise

## 2018-10-16 ENCOUNTER — Telehealth: Payer: Self-pay

## 2018-10-16 ENCOUNTER — Telehealth: Payer: Self-pay | Admitting: *Deleted

## 2018-10-16 NOTE — Telephone Encounter (Signed)
Permanent Comments   07-10-15 DPR SIGNED FOR [facility] OK TO DISCLOSE PHI BY PHONE TO FAYE Bedingfield OR BRIAN Baum OK TO LM ON HOME AND CELL VM [facility]

## 2018-10-16 NOTE — Telephone Encounter (Signed)
Tried calling patient Son at number given left message to call office back regarding patient.

## 2018-10-16 NOTE — Telephone Encounter (Signed)
recived this message from company website. Pt Son requesting to speak with Dr. Carles Collet.  Pt son Aaron Edelman is on DPR   Please be aware no contact information was given other then e-mail address.

## 2018-10-16 NOTE — Telephone Encounter (Signed)
Copied from [place] [id number]. Topic: General - Inquiry >> Oct 11, 2018  3:46 PM Erick Blinks wrote: Reason for CRM: [name] is coming in the morning to retrieve urine sample supplies

## 2018-10-16 NOTE — Telephone Encounter (Signed)
I will need you to get information on what he needs so we can assist.  I generally try to have take care of all business through my nursing/medical assisting staff if possible.

## 2018-10-16 NOTE — Telephone Encounter (Signed)
Patient Name     [place]   Email Address     [email or web address]   Date of Birth     1940/06/27   Subject     Garnet Koyanagi Charles's Son [facility])   Message     Hi,  I am trying to get in touch with Dr. Wells Guiles Tat, my father's Jearld Fenton [facility]) Neurologist. I have Dad's PoA, but I am currently living in [place], but would like to arrange a time to speak wth Dr. Carles Collet.   Can you please forward my email address to her ([email or web address])?  Thank you,  Aaron Edelman

## 2018-10-16 NOTE — Telephone Encounter (Signed)
Pt Son only left e-mail Unable to contact son via e-mail with patient healthcare information Contact patient Sister William Cummings she gave me patient Son contact # [phone] Will contact Son at this  For additional information.

## 2018-10-17 NOTE — Telephone Encounter (Signed)
noted 

## 2018-10-17 NOTE — Telephone Encounter (Signed)
Called patient Son again today no answer left voice mail to call office back.

## 2018-11-02 ENCOUNTER — Other Ambulatory Visit: Payer: Self-pay | Admitting: Family Medicine

## 2018-11-28 DIAGNOSIS — R488 Other symbolic dysfunctions: Secondary | ICD-10-CM | POA: Diagnosis not present

## 2018-11-28 DIAGNOSIS — M6281 Muscle weakness (generalized): Secondary | ICD-10-CM | POA: Diagnosis not present

## 2018-11-28 DIAGNOSIS — R262 Difficulty in walking, not elsewhere classified: Secondary | ICD-10-CM | POA: Diagnosis not present

## 2018-11-28 DIAGNOSIS — R296 Repeated falls: Secondary | ICD-10-CM | POA: Diagnosis not present

## 2018-11-29 DIAGNOSIS — R278 Other lack of coordination: Secondary | ICD-10-CM | POA: Diagnosis not present

## 2018-11-29 DIAGNOSIS — R488 Other symbolic dysfunctions: Secondary | ICD-10-CM | POA: Diagnosis not present

## 2018-11-29 DIAGNOSIS — M6281 Muscle weakness (generalized): Secondary | ICD-10-CM | POA: Diagnosis not present

## 2018-11-30 ENCOUNTER — Telehealth: Payer: Self-pay | Admitting: Neurology

## 2018-11-30 NOTE — Telephone Encounter (Signed)
Received orders from [facility] home therapy to sign for occupational therapy that was apparently recently done on the patient.  I have not seen the patient since October, 2019 and did not order any therapy, so was unable to sign the therapy evaluation.  I also noted that they have not called to schedule a follow-up appointment.  Hinton Dyer, will you call the patient's sister and find out if she will be bringing him in or she would like to schedule a virtual visit (I remember that she did not want to bring him in previously because of coronavirus).

## 2018-12-01 ENCOUNTER — Telehealth: Payer: Self-pay | Admitting: Neurology

## 2018-12-01 DIAGNOSIS — R278 Other lack of coordination: Secondary | ICD-10-CM | POA: Diagnosis not present

## 2018-12-01 DIAGNOSIS — R488 Other symbolic dysfunctions: Secondary | ICD-10-CM | POA: Diagnosis not present

## 2018-12-01 DIAGNOSIS — M6281 Muscle weakness (generalized): Secondary | ICD-10-CM | POA: Diagnosis not present

## 2018-12-01 NOTE — Telephone Encounter (Signed)
Called and left a very detail message for his sister to please call back to schedule a virtual visit

## 2018-12-01 NOTE — Telephone Encounter (Signed)
Pt is sch for a virtual visit on 12-08-18

## 2018-12-01 NOTE — Telephone Encounter (Signed)
thanks

## 2018-12-01 NOTE — Telephone Encounter (Signed)
Yes I will call patient's sister and see what they would like to do

## 2018-12-05 DIAGNOSIS — R41841 Cognitive communication deficit: Secondary | ICD-10-CM | POA: Diagnosis not present

## 2018-12-05 DIAGNOSIS — R4701 Aphasia: Secondary | ICD-10-CM | POA: Diagnosis not present

## 2018-12-05 DIAGNOSIS — R490 Dysphonia: Secondary | ICD-10-CM | POA: Diagnosis not present

## 2018-12-05 DIAGNOSIS — R1312 Dysphagia, oropharyngeal phase: Secondary | ICD-10-CM | POA: Diagnosis not present

## 2018-12-07 NOTE — Progress Notes (Deleted)
Virtual Visit via Video Note The purpose of this virtual visit is to provide medical care while limiting exposure to the novel coronavirus.    Consent was obtained for video visit:  {yes no:314532} Answered questions that patient had about telehealth interaction:  {yes no:314532} I discussed the limitations, risks, security and privacy concerns of performing an evaluation and management service by telemedicine. I also discussed with the patient that there may be a patient responsible charge related to this service. The patient expressed understanding and agreed to proceed.  Pt location: Home Physician Location: Sister Name of referring provider:  Deeann SaintBanks, Shannon R, MD I connected with William Cummings at patients initiation/request on 12/08/2018 at  2:00 PM EDT by video enabled telemedicine application and verified that I am speaking with the correct person using two identifiers. Pt MRN:  [id number] Pt DOB:  04/07/1941 Video Participants:  William Cummings;  ***   History of Present Illness: *** Patient seen today in follow-up, accompanied by his sister who supplements the history.  Have not seen the patient since October, 2019.  Patient has a history of Parkinson's disease with Parkinson's associated dementia.  Patient is supposed to be on carbidopa/levodopa 25/100, 2 tablets in the morning, 1 in the afternoon and 2 in the evening, but he is often missing the middle of the day dose.  He is on carbidopa/levodopa 50/200 at bedtime.  Medical records have been reviewed since last visit.  Was seen in the emergency room for a fall in January.  Had a CT of the brain which was unremarkable.  I reviewed this.  This did require staples to the scalp.   Current Outpatient Medications on File Prior to Visit  Medication Sig Dispense Refill  . carbidopa-levodopa (SINEMET CR) 50-200 MG tablet TAKE 1 TABLET BY MOUTH EVERYDAY AT BEDTIME 90 tablet 1  . carbidopa-levodopa (SINEMET IR) 25-100 MG tablet  TAKE 2 TABLETS IN THE MORNING, 1 TABLET IN THE AFTERNOON AND 2 TABLETS IN THE EVENING 450 tablet 0  . sertraline (ZOLOFT) 100 MG tablet TAKE 2 TABLETS BY MOUTH EVERY DAY 180 tablet 1   No current facility-administered medications on file prior to visit.      Observations/Objective:   There were no vitals filed for this visit. GEN:  The patient appears stated age and is in NAD.  Neurological examination:  Orientation: The patient is alert and oriented x3. Cranial nerves: There is good facial symmetry. There is ***facial hypomimia.  The speech is fluent and clear. Soft palate rises symmetrically and there is no tongue deviation. Hearing is intact to conversational tone. Motor: Strength is at least antigravity x 4.   Shoulder shrug is equal and symmetric.  There is no pronator drift.  Movement examination: Tone: unable Abnormal movements: *** Coordination:  There is *** decremation with RAM's, *** Gait and Station: The patient has *** difficulty arising out of a deep-seated chair without the use of the hands. The patient's stride length is ***.      Assessment and Plan:   1.  Parkinson's disease with PDD  -Really needs full-time medication administration and discussed this again today.  Is supposed to be on carbidopa/levodopa 25/100, 2 tablets in the morning, 1 in the afternoon and 2 in the evening, but often misses the middle of the day dose.  -Continue carbidopa/levodopa 50/200 at bedtime.  Follow Up Instructions:    -I discussed the assessment and treatment plan with the patient. The patient was provided an opportunity to  ask questions and all were answered. The patient agreed with the plan and demonstrated an understanding of the instructions.   The patient was advised to call back or seek an in-person evaluation if the symptoms worsen or if the condition fails to improve as anticipated.    Total Time spent in visit with the patient was:  ***, of which more than 50% of the time  was spent in counseling and/or coordinating care on ***.   Pt understands and agrees with the plan of care outlined.     Alonza Bogus, DO

## 2018-12-08 ENCOUNTER — Telehealth: Payer: Medicare HMO | Admitting: Neurology

## 2018-12-11 DIAGNOSIS — R490 Dysphonia: Secondary | ICD-10-CM | POA: Diagnosis not present

## 2018-12-11 DIAGNOSIS — R1312 Dysphagia, oropharyngeal phase: Secondary | ICD-10-CM | POA: Diagnosis not present

## 2018-12-11 DIAGNOSIS — R4701 Aphasia: Secondary | ICD-10-CM | POA: Diagnosis not present

## 2018-12-11 DIAGNOSIS — R41841 Cognitive communication deficit: Secondary | ICD-10-CM | POA: Diagnosis not present

## 2018-12-15 DIAGNOSIS — R1312 Dysphagia, oropharyngeal phase: Secondary | ICD-10-CM | POA: Diagnosis not present

## 2018-12-15 DIAGNOSIS — R4701 Aphasia: Secondary | ICD-10-CM | POA: Diagnosis not present

## 2018-12-15 DIAGNOSIS — R41841 Cognitive communication deficit: Secondary | ICD-10-CM | POA: Diagnosis not present

## 2018-12-15 DIAGNOSIS — R490 Dysphonia: Secondary | ICD-10-CM | POA: Diagnosis not present

## 2018-12-19 DIAGNOSIS — R490 Dysphonia: Secondary | ICD-10-CM | POA: Diagnosis not present

## 2018-12-19 DIAGNOSIS — R1312 Dysphagia, oropharyngeal phase: Secondary | ICD-10-CM | POA: Diagnosis not present

## 2018-12-19 DIAGNOSIS — R4701 Aphasia: Secondary | ICD-10-CM | POA: Diagnosis not present

## 2018-12-19 DIAGNOSIS — R41841 Cognitive communication deficit: Secondary | ICD-10-CM | POA: Diagnosis not present

## 2018-12-21 DIAGNOSIS — R490 Dysphonia: Secondary | ICD-10-CM | POA: Diagnosis not present

## 2018-12-21 DIAGNOSIS — R4701 Aphasia: Secondary | ICD-10-CM | POA: Diagnosis not present

## 2018-12-21 DIAGNOSIS — R41841 Cognitive communication deficit: Secondary | ICD-10-CM | POA: Diagnosis not present

## 2018-12-21 DIAGNOSIS — R1312 Dysphagia, oropharyngeal phase: Secondary | ICD-10-CM | POA: Diagnosis not present

## 2018-12-25 DIAGNOSIS — R41841 Cognitive communication deficit: Secondary | ICD-10-CM | POA: Diagnosis not present

## 2018-12-25 DIAGNOSIS — R490 Dysphonia: Secondary | ICD-10-CM | POA: Diagnosis not present

## 2018-12-25 DIAGNOSIS — R1312 Dysphagia, oropharyngeal phase: Secondary | ICD-10-CM | POA: Diagnosis not present

## 2018-12-25 DIAGNOSIS — R4701 Aphasia: Secondary | ICD-10-CM | POA: Diagnosis not present

## 2019-03-03 DIAGNOSIS — Z20828 Contact with and (suspected) exposure to other viral communicable diseases: Secondary | ICD-10-CM | POA: Diagnosis not present

## 2019-03-18 ENCOUNTER — Emergency Department (HOSPITAL_COMMUNITY): Payer: Medicare HMO

## 2019-03-18 ENCOUNTER — Emergency Department (HOSPITAL_COMMUNITY)
Admission: EM | Admit: 2019-03-18 | Discharge: 2019-03-18 | Disposition: A | Discharge status: Home / Self Care | Payer: Medicare HMO | Attending: Emergency Medicine | Admitting: Emergency Medicine

## 2019-03-18 ENCOUNTER — Other Ambulatory Visit: Payer: Self-pay

## 2019-03-18 ENCOUNTER — Encounter (HOSPITAL_COMMUNITY): Payer: Self-pay | Admitting: Emergency Medicine

## 2019-03-18 DIAGNOSIS — F0280 Dementia in other diseases classified elsewhere without behavioral disturbance: Secondary | ICD-10-CM | POA: Insufficient documentation

## 2019-03-18 DIAGNOSIS — R296 Repeated falls: Secondary | ICD-10-CM | POA: Diagnosis not present

## 2019-03-18 DIAGNOSIS — T796XXA Traumatic ischemia of muscle, initial encounter: Secondary | ICD-10-CM | POA: Insufficient documentation

## 2019-03-18 DIAGNOSIS — S0011XA Contusion of right eyelid and periocular area, initial encounter: Secondary | ICD-10-CM | POA: Diagnosis not present

## 2019-03-18 DIAGNOSIS — W19XXXA Unspecified fall, initial encounter: Secondary | ICD-10-CM | POA: Diagnosis not present

## 2019-03-18 DIAGNOSIS — Z79899 Other long term (current) drug therapy: Secondary | ICD-10-CM | POA: Diagnosis not present

## 2019-03-18 DIAGNOSIS — S0083XA Contusion of other part of head, initial encounter: Secondary | ICD-10-CM | POA: Diagnosis not present

## 2019-03-18 DIAGNOSIS — S52124A Nondisplaced fracture of head of right radius, initial encounter for closed fracture: Secondary | ICD-10-CM | POA: Insufficient documentation

## 2019-03-18 DIAGNOSIS — Y939 Activity, unspecified: Secondary | ICD-10-CM | POA: Insufficient documentation

## 2019-03-18 DIAGNOSIS — Y999 Unspecified external cause status: Secondary | ICD-10-CM | POA: Diagnosis not present

## 2019-03-18 DIAGNOSIS — R52 Pain, unspecified: Secondary | ICD-10-CM | POA: Diagnosis not present

## 2019-03-18 DIAGNOSIS — M25521 Pain in right elbow: Secondary | ICD-10-CM | POA: Diagnosis not present

## 2019-03-18 DIAGNOSIS — Y92129 Unspecified place in nursing home as the place of occurrence of the external cause: Secondary | ICD-10-CM | POA: Diagnosis not present

## 2019-03-18 DIAGNOSIS — S59901A Unspecified injury of right elbow, initial encounter: Secondary | ICD-10-CM | POA: Diagnosis not present

## 2019-03-18 DIAGNOSIS — R55 Syncope and collapse: Secondary | ICD-10-CM | POA: Diagnosis not present

## 2019-03-18 DIAGNOSIS — G20 Parkinson's disease: Secondary | ICD-10-CM | POA: Insufficient documentation

## 2019-03-18 DIAGNOSIS — I4510 Unspecified right bundle-branch block: Secondary | ICD-10-CM | POA: Diagnosis not present

## 2019-03-18 DIAGNOSIS — S6991XA Unspecified injury of right wrist, hand and finger(s), initial encounter: Secondary | ICD-10-CM | POA: Diagnosis not present

## 2019-03-18 DIAGNOSIS — S0003XA Contusion of scalp, initial encounter: Secondary | ICD-10-CM | POA: Diagnosis not present

## 2019-03-18 DIAGNOSIS — R9431 Abnormal electrocardiogram [ECG] [EKG]: Secondary | ICD-10-CM | POA: Diagnosis not present

## 2019-03-18 LAB — CBC WITH DIFFERENTIAL/PLATELET
Abs Immature Granulocytes: 0.04 10*3/uL (ref 0.00–0.07)
Basophils Absolute: 0 10*3/uL (ref 0.0–0.1)
Basophils Relative: 0 %
Eosinophils Absolute: 0 10*3/uL (ref 0.0–0.5)
Eosinophils Relative: 0 %
HCT: 46.2 % (ref 39.0–52.0)
Hemoglobin: 15.2 g/dL (ref 13.0–17.0)
Immature Granulocytes: 0 %
Lymphocytes Relative: 6 %
Lymphs Abs: 0.6 10*3/uL — ABNORMAL LOW (ref 0.7–4.0)
MCH: 31.5 pg (ref 26.0–34.0)
MCHC: 32.9 g/dL (ref 30.0–36.0)
MCV: 95.7 fL (ref 80.0–100.0)
Monocytes Absolute: 1 10*3/uL (ref 0.1–1.0)
Monocytes Relative: 10 %
Neutro Abs: 8 10*3/uL — ABNORMAL HIGH (ref 1.7–7.7)
Neutrophils Relative %: 84 %
Platelets: 191 10*3/uL (ref 150–400)
RBC: 4.83 MIL/uL (ref 4.22–5.81)
RDW: 12.7 % (ref 11.5–15.5)
WBC: 9.7 10*3/uL (ref 4.0–10.5)
nRBC: 0 % (ref 0.0–0.2)

## 2019-03-18 LAB — BASIC METABOLIC PANEL
Anion gap: 9 (ref 5–15)
BUN: 26 mg/dL — ABNORMAL HIGH (ref 8–23)
CO2: 22 mmol/L (ref 22–32)
Calcium: 8.8 mg/dL — ABNORMAL LOW (ref 8.9–10.3)
Chloride: 108 mmol/L (ref 98–111)
Creatinine, Ser: 0.87 mg/dL (ref 0.61–1.24)
GFR calc Af Amer: >60 mL/min (ref >60)
GFR calc non Af Amer: >60 mL/min (ref >60)
Glucose, Bld: 114 mg/dL — ABNORMAL HIGH (ref 70–99)
Potassium: 3.4 mmol/L — ABNORMAL LOW (ref 3.5–5.1)
Sodium: 139 mmol/L (ref 135–145)

## 2019-03-18 LAB — CK: Total CK: 2991 U/L — ABNORMAL HIGH (ref 49–397)

## 2019-03-18 MED ORDER — SODIUM CHLORIDE 0.9 % IV BOLUS
1000.0000 mL | Freq: Once | INTRAVENOUS | Status: AC
  Administered 2019-03-18: 12:00:00 1000 mL via INTRAVENOUS

## 2019-03-18 NOTE — ED Notes (Signed)
Pt and pt family member aware that urine sample is ordered. Pt states he will be able to urinate soon.

## 2019-03-18 NOTE — ED Notes (Signed)
An After Visit Summary was printed and given to the patient. Discharge instructions given and no further questions at this time.  Pt leaving with sister.  

## 2019-03-18 NOTE — Discharge Instructions (Addendum)
You have a small fracture of the right elbow.  You may wish to follow-up with orthopedics if this continues to be painful.  Otherwise you may follow-up with your primary care doctor.

## 2019-03-18 NOTE — ED Notes (Signed)
Patient transported to CT 

## 2019-03-18 NOTE — ED Notes (Signed)
ED Provider at bedside. 

## 2019-03-18 NOTE — ED Provider Notes (Addendum)
[facility]   CSN: [id number] Arrival date & time: 03/18/19  [id number]     History   Chief Complaint Chief Complaint  Patient presents with   Fall    HPI William Cummings is a 78 y.o. male  William Cummings is a 78 y.o. male with a past medical history of parkinson's disease, depression, who presents today for evaluation of a ground-level fall.     HPI  Patient is 78 year old male presented today for fall that occurred sometime presumably between yesterday afternoon and this morning at 8 AM. Patient states he remembers being found this morning and remembers being transported by EMS to the hospital however he states he does not member how he fell or events preceding his fall.  Patient states that he gets around with his rolling walker.  He uses this consistently.  Patient denies any headache, nausea or vomiting.  Denies any current pain.  States that he has had bruising/rash on his right lower extremity for a long time.  Denies chest pain, shortness of breath, dizziness.  Denies muscle aches.   Larey SeatFell in apartment found at 8am [facility]. Was laying face down beside bed on carpet. He was talking, at baseline which is softspoken and quiet per Production designer, theatre/television/filmmanager.  Patient is baseline unsure of date and location. There was a small pool of blood next to patient. Meal delivered yesterday afternoon. Unsure of when last seen before fall.   Past Medical History:  Diagnosis Date   Depression    Parkinson's disease [facility](HCC)     Patient Active Problem List   Diagnosis Date Noted   Dementia associated with Parkinson's disease (HCC) 10/09/2018   Parkinson's disease (HCC) 08/03/2017   Depression, recurrent (HCC) 08/03/2017    History reviewed. No pertinent surgical history.      Home Medications    Prior to Admission medications   Medication Sig Start Date End Date Taking? Authorizing Provider  carbidopa-levodopa  (SINEMET CR) 50-200 MG tablet TAKE 1 TABLET BY MOUTH EVERYDAY AT BEDTIME Patient taking differently: Take 1 tablet by mouth at bedtime.  08/02/18  Yes Tat, Octaviano Battyebecca S, DO  carbidopa-levodopa (SINEMET IR) 25-100 MG tablet TAKE 2 TABLETS IN THE MORNING, 1 TABLET IN THE AFTERNOON AND 2 TABLETS IN THE EVENING Patient taking differently: Take 1-2 tablets by mouth See admin instructions. TAKE 2 TABLETS IN THE MORNING, 1 TABLET IN THE AFTERNOON AND 2 TABLETS IN THE EVENING 07/19/18  Yes Tat, Rebecca S, DO  sertraline (ZOLOFT) 100 MG tablet TAKE 2 TABLETS BY MOUTH EVERY DAY Patient taking differently: Take 200 mg by mouth daily.  11/02/18  Yes Deeann SaintBanks, Shannon R, MD    Family History Family History  Problem Relation Age of Onset   Heart attack Father    Cancer Sister    Early death Sister    Depression Brother     Social History Social History   Tobacco Use   Smoking status: Never Smoker   Smokeless tobacco: Never Used  Substance Use Topics   Alcohol use: Yes    Alcohol/week: 0.0 standard drinks    Comment: glass of wine once every couple months   Drug use: No     Allergies   Patient has no known allergies.   Review of Systems Review of Systems  Constitutional: Negative for chills and fever.  HENT: Negative for congestion.   Respiratory: Negative for cough and shortness of breath.   Cardiovascular: Negative for chest  pain.  Gastrointestinal: Negative for abdominal pain.  Musculoskeletal: Negative for back pain.       Right elbow pain  Neurological: Positive for tremors. Negative for speech difficulty, light-headedness, numbness and headaches.     Physical Exam Updated Vital Signs BP (!) 153/72    Pulse 64    Temp 98.7 F (37.1 C) (Oral)    Resp 17    SpO2 100%   Physical Exam Vitals signs and nursing note reviewed.  Constitutional:      General: He is not in acute distress. HENT:     Head:     Comments: Patient has significant right-sided forehead and periorbital  contusion. Superior orbital bony tenderness.  No signs of entrapment, no pain with EOM    Nose: Nose normal.     Mouth/Throat:     Mouth: Mucous membranes are moist.     Comments: Poor dentition.  Missing teeth Eyes:     General: No scleral icterus.    Extraocular Movements: Extraocular movements intact.  Neck:     Musculoskeletal: Normal range of motion.  Cardiovascular:     Rate and Rhythm: Normal rate and regular rhythm.     Pulses: Normal pulses.     Heart sounds: Normal heart sounds.  Pulmonary:     Effort: Pulmonary effort is normal. No respiratory distress.     Breath sounds: No wheezing.  Abdominal:     Palpations: Abdomen is soft.     Tenderness: There is no abdominal tenderness.  Musculoskeletal:     Right lower leg: No edema.     Left lower leg: No edema.     Comments: Tenderness to palpation over the right olecranon.  No other bony tenderness over joints or long bones of the upper and lower extremities.    Patient is in cervical collar.  Unable to assess range of motion or midline neck tenderness.  Back without midline tenderness, step-off, deformity, or bruising.   Full range of motion of upper and lower extremity joints shown after palpation was conducted; with 5/5 symmetrical strength in upper and lower extremities. No chest wall tenderness  Patient has intact sensation grossly in lower and upper extremities. Patient able to ambulate without difficulty.  Radial and DP pulses palpated BL.    Skin:    General: Skin is warm and dry.     Capillary Refill: Capillary refill takes less than 2 seconds.  Neurological:     Mental Status: He is alert. Mental status is at baseline.     Comments: Resting tremor worse with full extension of arms.  AO to self, date, location and current events (biden)  Speech is soft with sparse wording, clear without dysarthria or dysphasia.   Strength 5/5 in upper/lower extremities  Sensation intact in upper/lower extremities    Gait deferred as patient is feeble and without walker. Negative Romberg. No pronator drift.  Normal finger-to-nose and feet tapping.  CN I not tested  CN II grossly intact visual fields bilaterally. Did not visualize posterior eye.   CN III, IV, VI PERRLA and EOMs intact bilaterally  CN V Intact sensation to sharp and light touch to the face  CN VII facial movements symmetric  CN VIII not tested  CN IX, X no uvula deviation, symmetric rise of soft palate  CN XI 5/5 SCM and trapezius strength bilaterally  CN XII Midline tongue protrusion, symmetric L/R movements   Psychiatric:        Mood and Affect: Mood normal.  Behavior: Behavior normal.      ED Treatments / Results  Labs (all labs ordered are listed, but only abnormal results are displayed) Labs Reviewed  BASIC METABOLIC PANEL - Abnormal; Notable for the following components:      Result Value   Potassium 3.4 (*)    Glucose, Bld 114 (*)    BUN 26 (*)    Calcium 8.8 (*)    All other components within normal limits  CBC WITH DIFFERENTIAL/PLATELET - Abnormal; Notable for the following components:   Neutro Abs 8.0 (*)    Lymphs Abs 0.6 (*)    All other components within normal limits  CK - Abnormal; Notable for the following components:   Total CK 2,991 (*)    All other components within normal limits  URINALYSIS, ROUTINE W REFLEX MICROSCOPIC    EKG EKG Interpretation  Date/Time:  Sunday March 18 2019 09:57:49 EST Ventricular Rate:  73 PR Interval:    QRS Duration: 133 QT Interval:  534 QTC Calculation: 589 R Axis:   -80 Text Interpretation: Sinus rhythm Atrial premature complex RBBB and LAFB Probable left ventricular hypertrophy Nonspecific T abnormalities, lateral leads No significant change since last tracing Confirmed by Fredia Sorrow [phone]) on 03/18/2019 10:15:50 AM   Radiology Dg Elbow 2 Views Right  Result Date: 03/18/2019 CLINICAL DATA:  Fall, elbow pain EXAM: RIGHT ELBOW - 2 VIEW  COMPARISON:  None. FINDINGS: Slightly suboptimal patient positioning on lateral view degrades evaluation for joint effusion. There is elevation of the anterior fat pad. Subtle cortical lucency at the radial head-neck junction seen only on lateral view suspicious for a nondisplaced fracture. Enthesopathic changes at the common extensor tendon origin. No dislocation. IMPRESSION: Findings suspicious for an acute nondisplaced fracture of the radial head-neck. Correlate with point tenderness. Electronically Signed   By: Davina Poke M.D.   On: 03/18/2019 11:32   Ct Head Wo Contrast  Result Date: 03/18/2019 CLINICAL DATA:  Found down on floor with incontinence. EXAM: CT HEAD WITHOUT CONTRAST CT CERVICAL SPINE WITHOUT CONTRAST TECHNIQUE: Multidetector CT imaging of the head and cervical spine was performed following the standard protocol without intravenous contrast. Multiplanar CT image reconstructions of the cervical spine were also generated. COMPARISON:  05/19/2018 head and cervical spine CT. FINDINGS: CT HEAD FINDINGS Brain: No evidence of parenchymal hemorrhage or extra-axial fluid collection. No mass lesion, mass effect, or midline shift. No CT evidence of acute infarction. Generalized cerebral volume loss. Nonspecific moderate subcortical and periventricular white matter hypodensity, most in keeping with chronic small vessel ischemic change. Cerebral ventricle sizes are stable and concordant with the degree of cerebral volume loss. Vascular: No acute abnormality. Skull: No evidence of calvarial fracture. Moderate subgaleal midline frontal scalp hematoma. Sinuses/Orbits: Mucoperiosteal thickening in the bilateral maxillary sinuses and ethmoidal air cells. No fluid levels. Other:  The mastoid air cells are unopacified. CT CERVICAL SPINE FINDINGS Alignment: Slight reversal of the normal cervical lordosis, usually due to positioning and/or muscle spasm. No facet subluxation. Dens is well positioned between the  lateral masses of C1. Skull base and vertebrae: No acute fracture. No primary bone lesion or focal pathologic process. Soft tissues and spinal canal: No prevertebral edema. No visible canal hematoma. Disc levels: Moderate degenerative disc disease throughout the cervical spine. Advanced bilateral facet arthropathy. Moderate right and mild left degenerative foraminal stenosis at C2-3. Severe bilateral degenerative foraminal stenosis at C3-4 and C4-5. Mild degenerative foraminal stenosis bilaterally at C5-6 and C6-7. Upper chest: No acute abnormality. Other: Visualized mastoid  air cells appear clear. No discrete thyroid nodules. No pathologically enlarged cervical nodes. IMPRESSION: 1. Moderate midline frontal scalp hematoma. No evidence of acute intracranial abnormality. No evidence of calvarial fracture. 2. Generalized cerebral volume loss and moderate chronic small vessel ischemic changes in the cerebral white matter. 3. No cervical spine fracture or facet subluxation. 4. Moderate to marked multilevel degenerative changes in the cervical spine as detailed. 5. Mild chronic bilateral paranasal sinusitis. Electronically Signed   By: Delbert Phenix M.D.   On: 03/18/2019 11:12   Ct Cervical Spine Wo Contrast  Result Date: 03/18/2019 CLINICAL DATA:  Found down on floor with incontinence. EXAM: CT HEAD WITHOUT CONTRAST CT CERVICAL SPINE WITHOUT CONTRAST TECHNIQUE: Multidetector CT imaging of the head and cervical spine was performed following the standard protocol without intravenous contrast. Multiplanar CT image reconstructions of the cervical spine were also generated. COMPARISON:  05/19/2018 head and cervical spine CT. FINDINGS: CT HEAD FINDINGS Brain: No evidence of parenchymal hemorrhage or extra-axial fluid collection. No mass lesion, mass effect, or midline shift. No CT evidence of acute infarction. Generalized cerebral volume loss. Nonspecific moderate subcortical and periventricular white matter hypodensity,  most in keeping with chronic small vessel ischemic change. Cerebral ventricle sizes are stable and concordant with the degree of cerebral volume loss. Vascular: No acute abnormality. Skull: No evidence of calvarial fracture. Moderate subgaleal midline frontal scalp hematoma. Sinuses/Orbits: Mucoperiosteal thickening in the bilateral maxillary sinuses and ethmoidal air cells. No fluid levels. Other:  The mastoid air cells are unopacified. CT CERVICAL SPINE FINDINGS Alignment: Slight reversal of the normal cervical lordosis, usually due to positioning and/or muscle spasm. No facet subluxation. Dens is well positioned between the lateral masses of C1. Skull base and vertebrae: No acute fracture. No primary bone lesion or focal pathologic process. Soft tissues and spinal canal: No prevertebral edema. No visible canal hematoma. Disc levels: Moderate degenerative disc disease throughout the cervical spine. Advanced bilateral facet arthropathy. Moderate right and mild left degenerative foraminal stenosis at C2-3. Severe bilateral degenerative foraminal stenosis at C3-4 and C4-5. Mild degenerative foraminal stenosis bilaterally at C5-6 and C6-7. Upper chest: No acute abnormality. Other: Visualized mastoid air cells appear clear. No discrete thyroid nodules. No pathologically enlarged cervical nodes. IMPRESSION: 1. Moderate midline frontal scalp hematoma. No evidence of acute intracranial abnormality. No evidence of calvarial fracture. 2. Generalized cerebral volume loss and moderate chronic small vessel ischemic changes in the cerebral white matter. 3. No cervical spine fracture or facet subluxation. 4. Moderate to marked multilevel degenerative changes in the cervical spine as detailed. 5. Mild chronic bilateral paranasal sinusitis. Electronically Signed   By: Delbert Phenix M.D.   On: 03/18/2019 11:12    Procedures Procedures (including critical care time)  Medications Ordered in ED Medications  sodium chloride 0.9  % bolus 1,000 mL (0 mLs Intravenous Stopped 03/18/19 1323)     Initial Impression / Assessment and Plan / ED Course  I have reviewed the triage vital signs and the nursing notes.  Pertinent labs & imaging results that were available during my care of the patient were reviewed by me and considered in my medical decision making (see chart for details).        Patient is 78 year old male presenting today with fall that occurred sometime between yesterday afternoon and this morning.  Was found on the ground.  Has contusion to the right forehead.  Appears to be at baseline.  Collateral story changed from [place] [facility].   Patient has blood  work that is within normal limits apart from elevated CK at approximately 3000 but less than 5000. Patient has baseline kidney function with mildly elevated BUN likely due to dehydration.  Patient has received fluids.  As well recheck.  CT scan of the head and neck without abnormalities.  Right elbow x-ray shows small, nondisplaced radial head fracture.  There is tenderness to palpation however is full range of motion without pain.  Recommend that he follow-up with an orthopedist for reevaluation with your primary care doctor.  However I believe this will heal well on his own.  Patient given return precautions.  Daughter at bedside understands that patient will likely need SNF placement eventually as he is having increased falls.   Daughter at bedside explains that her brother is power of attorney currently living out of the country.   Unable to arrange SNF placement today however discussed with daughter and she will take patient home with her tonight and try to arrange for him to be placed within the next week.  Patient with reassuring vitals, breathing well.  Mentating at baseline.  No dictation for further labs or imaging at this time. Discharged with daughter.   This patient appears reasonably screened and I doubt any other medical condition  requiring further workup, evaluation, or treatment in the ED at this time prior to discharge.   Patient's vitals are WNL apart from vital sign abnormalities discussed above, patient is in NAD, and able to ambulate in the ED at their baseline. Pain has been managed or a plan has been made for home management and has no complaints prior to discharge. Patient is comfortable with above plan and is stable for discharge at this time. All questions were answered prior to disposition. Results from the ER workup discussed with the patient face to face and all questions answered to the best of my ability. The patient is safe for discharge with strict return precautions. Patient appears safe for discharge with appropriate follow-up. Conveyed my impression with the patient and they voiced understanding and are agreeable to plan.   An After Visit Summary was printed and given to the patient.  Portions of this note were generated with Scientist, clinical (histocompatibility and immunogenetics). Dictation errors may occur despite best attempts at proofreading.    Final Clinical Impressions(s) / ED Diagnoses   Final diagnoses:  Closed nondisplaced fracture of head of right radius, initial encounter  Fall, initial encounter    ED Discharge Orders    None       Gailen Shelter, [place] 03/18/19 1351    Vanetta Mulders, MD 03/18/19 1400    Solon Augusta [place], [place] 03/18/19 2118    Vanetta Mulders, MD 03/26/19 [id number]

## 2019-03-18 NOTE — ED Triage Notes (Signed)
Per [facility] pt from [facility] found laying on floor for an unknown time covered in on urine and stool. Pt was in prone position with arms underneath him. When got patient up, arms and limbs were stiff.  Pt states that he doesn't know what happened to cause him to be on the floor. Pt has 18g in left forearm. Given NS 900 ml in route.

## 2019-03-28 DIAGNOSIS — M6281 Muscle weakness (generalized): Secondary | ICD-10-CM | POA: Diagnosis not present

## 2019-03-28 DIAGNOSIS — G20 Parkinson's disease: Secondary | ICD-10-CM | POA: Diagnosis not present

## 2019-03-28 DIAGNOSIS — R2689 Other abnormalities of gait and mobility: Secondary | ICD-10-CM | POA: Diagnosis not present

## 2019-03-28 DIAGNOSIS — R2681 Unsteadiness on feet: Secondary | ICD-10-CM | POA: Diagnosis not present

## 2019-03-28 DIAGNOSIS — R296 Repeated falls: Secondary | ICD-10-CM | POA: Diagnosis not present

## 2019-03-29 DIAGNOSIS — G20 Parkinson's disease: Secondary | ICD-10-CM | POA: Diagnosis not present

## 2019-03-29 DIAGNOSIS — R2681 Unsteadiness on feet: Secondary | ICD-10-CM | POA: Diagnosis not present

## 2019-03-29 DIAGNOSIS — M6281 Muscle weakness (generalized): Secondary | ICD-10-CM | POA: Diagnosis not present

## 2019-03-30 DIAGNOSIS — G20 Parkinson's disease: Secondary | ICD-10-CM | POA: Diagnosis not present

## 2019-03-30 DIAGNOSIS — R296 Repeated falls: Secondary | ICD-10-CM | POA: Diagnosis not present

## 2019-03-30 DIAGNOSIS — R2689 Other abnormalities of gait and mobility: Secondary | ICD-10-CM | POA: Diagnosis not present

## 2019-03-30 DIAGNOSIS — R2681 Unsteadiness on feet: Secondary | ICD-10-CM | POA: Diagnosis not present

## 2019-04-02 DIAGNOSIS — R2681 Unsteadiness on feet: Secondary | ICD-10-CM | POA: Diagnosis not present

## 2019-04-02 DIAGNOSIS — G20 Parkinson's disease: Secondary | ICD-10-CM | POA: Diagnosis not present

## 2019-04-02 DIAGNOSIS — M6281 Muscle weakness (generalized): Secondary | ICD-10-CM | POA: Diagnosis not present

## 2019-04-03 DIAGNOSIS — R1312 Dysphagia, oropharyngeal phase: Secondary | ICD-10-CM | POA: Diagnosis not present

## 2019-04-03 DIAGNOSIS — M6281 Muscle weakness (generalized): Secondary | ICD-10-CM | POA: Diagnosis not present

## 2019-04-03 DIAGNOSIS — R2681 Unsteadiness on feet: Secondary | ICD-10-CM | POA: Diagnosis not present

## 2019-04-03 DIAGNOSIS — G20 Parkinson's disease: Secondary | ICD-10-CM | POA: Diagnosis not present

## 2019-04-03 DIAGNOSIS — R41841 Cognitive communication deficit: Secondary | ICD-10-CM | POA: Diagnosis not present

## 2019-04-04 DIAGNOSIS — G20 Parkinson's disease: Secondary | ICD-10-CM | POA: Diagnosis not present

## 2019-04-04 DIAGNOSIS — R296 Repeated falls: Secondary | ICD-10-CM | POA: Diagnosis not present

## 2019-04-04 DIAGNOSIS — R2689 Other abnormalities of gait and mobility: Secondary | ICD-10-CM | POA: Diagnosis not present

## 2019-04-04 DIAGNOSIS — R2681 Unsteadiness on feet: Secondary | ICD-10-CM | POA: Diagnosis not present

## 2019-04-05 DIAGNOSIS — R2681 Unsteadiness on feet: Secondary | ICD-10-CM | POA: Diagnosis not present

## 2019-04-05 DIAGNOSIS — M6281 Muscle weakness (generalized): Secondary | ICD-10-CM | POA: Diagnosis not present

## 2019-04-05 DIAGNOSIS — G20 Parkinson's disease: Secondary | ICD-10-CM | POA: Diagnosis not present

## 2019-04-05 DIAGNOSIS — R296 Repeated falls: Secondary | ICD-10-CM | POA: Diagnosis not present

## 2019-04-05 DIAGNOSIS — R2689 Other abnormalities of gait and mobility: Secondary | ICD-10-CM | POA: Diagnosis not present

## 2019-04-06 DIAGNOSIS — G20 Parkinson's disease: Secondary | ICD-10-CM | POA: Diagnosis not present

## 2019-04-06 DIAGNOSIS — M6281 Muscle weakness (generalized): Secondary | ICD-10-CM | POA: Diagnosis not present

## 2019-04-06 DIAGNOSIS — R1312 Dysphagia, oropharyngeal phase: Secondary | ICD-10-CM | POA: Diagnosis not present

## 2019-04-06 DIAGNOSIS — R2681 Unsteadiness on feet: Secondary | ICD-10-CM | POA: Diagnosis not present

## 2019-04-06 DIAGNOSIS — R2689 Other abnormalities of gait and mobility: Secondary | ICD-10-CM | POA: Diagnosis not present

## 2019-04-06 DIAGNOSIS — R41841 Cognitive communication deficit: Secondary | ICD-10-CM | POA: Diagnosis not present

## 2019-04-06 DIAGNOSIS — R296 Repeated falls: Secondary | ICD-10-CM | POA: Diagnosis not present

## 2019-04-09 DIAGNOSIS — R41841 Cognitive communication deficit: Secondary | ICD-10-CM | POA: Diagnosis not present

## 2019-04-09 DIAGNOSIS — G20 Parkinson's disease: Secondary | ICD-10-CM | POA: Diagnosis not present

## 2019-04-09 DIAGNOSIS — R2681 Unsteadiness on feet: Secondary | ICD-10-CM | POA: Diagnosis not present

## 2019-04-09 DIAGNOSIS — M6281 Muscle weakness (generalized): Secondary | ICD-10-CM | POA: Diagnosis not present

## 2019-04-09 DIAGNOSIS — R1312 Dysphagia, oropharyngeal phase: Secondary | ICD-10-CM | POA: Diagnosis not present

## 2019-04-10 DIAGNOSIS — R296 Repeated falls: Secondary | ICD-10-CM | POA: Diagnosis not present

## 2019-04-10 DIAGNOSIS — R2689 Other abnormalities of gait and mobility: Secondary | ICD-10-CM | POA: Diagnosis not present

## 2019-04-10 DIAGNOSIS — M6281 Muscle weakness (generalized): Secondary | ICD-10-CM | POA: Diagnosis not present

## 2019-04-10 DIAGNOSIS — R2681 Unsteadiness on feet: Secondary | ICD-10-CM | POA: Diagnosis not present

## 2019-04-10 DIAGNOSIS — G20 Parkinson's disease: Secondary | ICD-10-CM | POA: Diagnosis not present

## 2019-04-12 DIAGNOSIS — R2681 Unsteadiness on feet: Secondary | ICD-10-CM | POA: Diagnosis not present

## 2019-04-12 DIAGNOSIS — R41841 Cognitive communication deficit: Secondary | ICD-10-CM | POA: Diagnosis not present

## 2019-04-12 DIAGNOSIS — R2689 Other abnormalities of gait and mobility: Secondary | ICD-10-CM | POA: Diagnosis not present

## 2019-04-12 DIAGNOSIS — R1312 Dysphagia, oropharyngeal phase: Secondary | ICD-10-CM | POA: Diagnosis not present

## 2019-04-12 DIAGNOSIS — R296 Repeated falls: Secondary | ICD-10-CM | POA: Diagnosis not present

## 2019-04-12 DIAGNOSIS — G20 Parkinson's disease: Secondary | ICD-10-CM | POA: Diagnosis not present

## 2019-04-13 DIAGNOSIS — M6281 Muscle weakness (generalized): Secondary | ICD-10-CM | POA: Diagnosis not present

## 2019-04-13 DIAGNOSIS — R296 Repeated falls: Secondary | ICD-10-CM | POA: Diagnosis not present

## 2019-04-13 DIAGNOSIS — R2681 Unsteadiness on feet: Secondary | ICD-10-CM | POA: Diagnosis not present

## 2019-04-13 DIAGNOSIS — G20 Parkinson's disease: Secondary | ICD-10-CM | POA: Diagnosis not present

## 2019-04-13 DIAGNOSIS — R2689 Other abnormalities of gait and mobility: Secondary | ICD-10-CM | POA: Diagnosis not present

## 2019-04-16 DIAGNOSIS — R2681 Unsteadiness on feet: Secondary | ICD-10-CM | POA: Diagnosis not present

## 2019-04-16 DIAGNOSIS — M6281 Muscle weakness (generalized): Secondary | ICD-10-CM | POA: Diagnosis not present

## 2019-04-16 DIAGNOSIS — G20 Parkinson's disease: Secondary | ICD-10-CM | POA: Diagnosis not present

## 2019-04-17 DIAGNOSIS — R1312 Dysphagia, oropharyngeal phase: Secondary | ICD-10-CM | POA: Diagnosis not present

## 2019-04-17 DIAGNOSIS — R2681 Unsteadiness on feet: Secondary | ICD-10-CM | POA: Diagnosis not present

## 2019-04-17 DIAGNOSIS — G20 Parkinson's disease: Secondary | ICD-10-CM | POA: Diagnosis not present

## 2019-04-17 DIAGNOSIS — R41841 Cognitive communication deficit: Secondary | ICD-10-CM | POA: Diagnosis not present

## 2019-04-17 DIAGNOSIS — M6281 Muscle weakness (generalized): Secondary | ICD-10-CM | POA: Diagnosis not present

## 2019-04-18 DIAGNOSIS — R2681 Unsteadiness on feet: Secondary | ICD-10-CM | POA: Diagnosis not present

## 2019-04-18 DIAGNOSIS — R296 Repeated falls: Secondary | ICD-10-CM | POA: Diagnosis not present

## 2019-04-18 DIAGNOSIS — R2689 Other abnormalities of gait and mobility: Secondary | ICD-10-CM | POA: Diagnosis not present

## 2019-04-18 DIAGNOSIS — G20 Parkinson's disease: Secondary | ICD-10-CM | POA: Diagnosis not present

## 2019-04-19 DIAGNOSIS — G20 Parkinson's disease: Secondary | ICD-10-CM | POA: Diagnosis not present

## 2019-04-19 DIAGNOSIS — R2689 Other abnormalities of gait and mobility: Secondary | ICD-10-CM | POA: Diagnosis not present

## 2019-04-19 DIAGNOSIS — R296 Repeated falls: Secondary | ICD-10-CM | POA: Diagnosis not present

## 2019-04-19 DIAGNOSIS — M6281 Muscle weakness (generalized): Secondary | ICD-10-CM | POA: Diagnosis not present

## 2019-04-19 DIAGNOSIS — R2681 Unsteadiness on feet: Secondary | ICD-10-CM | POA: Diagnosis not present

## 2019-04-23 DIAGNOSIS — R41841 Cognitive communication deficit: Secondary | ICD-10-CM | POA: Diagnosis not present

## 2019-04-23 DIAGNOSIS — G20 Parkinson's disease: Secondary | ICD-10-CM | POA: Diagnosis not present

## 2019-04-23 DIAGNOSIS — R1312 Dysphagia, oropharyngeal phase: Secondary | ICD-10-CM | POA: Diagnosis not present

## 2019-04-24 DIAGNOSIS — M6281 Muscle weakness (generalized): Secondary | ICD-10-CM | POA: Diagnosis not present

## 2019-04-24 DIAGNOSIS — R2681 Unsteadiness on feet: Secondary | ICD-10-CM | POA: Diagnosis not present

## 2019-04-24 DIAGNOSIS — R296 Repeated falls: Secondary | ICD-10-CM | POA: Diagnosis not present

## 2019-04-24 DIAGNOSIS — R2689 Other abnormalities of gait and mobility: Secondary | ICD-10-CM | POA: Diagnosis not present

## 2019-04-24 DIAGNOSIS — G20 Parkinson's disease: Secondary | ICD-10-CM | POA: Diagnosis not present

## 2019-04-25 DIAGNOSIS — R1312 Dysphagia, oropharyngeal phase: Secondary | ICD-10-CM | POA: Diagnosis not present

## 2019-04-25 DIAGNOSIS — G20 Parkinson's disease: Secondary | ICD-10-CM | POA: Diagnosis not present

## 2019-04-25 DIAGNOSIS — R41841 Cognitive communication deficit: Secondary | ICD-10-CM | POA: Diagnosis not present

## 2019-04-26 DIAGNOSIS — R296 Repeated falls: Secondary | ICD-10-CM | POA: Diagnosis not present

## 2019-04-26 DIAGNOSIS — G20 Parkinson's disease: Secondary | ICD-10-CM | POA: Diagnosis not present

## 2019-04-26 DIAGNOSIS — M6281 Muscle weakness (generalized): Secondary | ICD-10-CM | POA: Diagnosis not present

## 2019-04-26 DIAGNOSIS — R2689 Other abnormalities of gait and mobility: Secondary | ICD-10-CM | POA: Diagnosis not present

## 2019-04-26 DIAGNOSIS — R2681 Unsteadiness on feet: Secondary | ICD-10-CM | POA: Diagnosis not present

## 2019-04-27 DIAGNOSIS — R2681 Unsteadiness on feet: Secondary | ICD-10-CM | POA: Diagnosis not present

## 2019-04-27 DIAGNOSIS — R296 Repeated falls: Secondary | ICD-10-CM | POA: Diagnosis not present

## 2019-04-27 DIAGNOSIS — G20 Parkinson's disease: Secondary | ICD-10-CM | POA: Diagnosis not present

## 2019-04-27 DIAGNOSIS — R2689 Other abnormalities of gait and mobility: Secondary | ICD-10-CM | POA: Diagnosis not present

## 2019-05-01 DIAGNOSIS — R41841 Cognitive communication deficit: Secondary | ICD-10-CM | POA: Diagnosis not present

## 2019-05-01 DIAGNOSIS — G20 Parkinson's disease: Secondary | ICD-10-CM | POA: Diagnosis not present

## 2019-05-01 DIAGNOSIS — R2689 Other abnormalities of gait and mobility: Secondary | ICD-10-CM | POA: Diagnosis not present

## 2019-05-01 DIAGNOSIS — R2681 Unsteadiness on feet: Secondary | ICD-10-CM | POA: Diagnosis not present

## 2019-05-01 DIAGNOSIS — R296 Repeated falls: Secondary | ICD-10-CM | POA: Diagnosis not present

## 2019-05-01 DIAGNOSIS — R1312 Dysphagia, oropharyngeal phase: Secondary | ICD-10-CM | POA: Diagnosis not present

## 2019-05-05 ENCOUNTER — Other Ambulatory Visit: Payer: Self-pay | Admitting: Neurology

## 2019-05-07 DIAGNOSIS — G20 Parkinson's disease: Secondary | ICD-10-CM | POA: Diagnosis not present

## 2019-05-07 DIAGNOSIS — R296 Repeated falls: Secondary | ICD-10-CM | POA: Diagnosis not present

## 2019-05-07 DIAGNOSIS — R1312 Dysphagia, oropharyngeal phase: Secondary | ICD-10-CM | POA: Diagnosis not present

## 2019-05-07 DIAGNOSIS — R2681 Unsteadiness on feet: Secondary | ICD-10-CM | POA: Diagnosis not present

## 2019-05-07 DIAGNOSIS — R2689 Other abnormalities of gait and mobility: Secondary | ICD-10-CM | POA: Diagnosis not present

## 2019-05-07 DIAGNOSIS — R41841 Cognitive communication deficit: Secondary | ICD-10-CM | POA: Diagnosis not present

## 2019-05-08 ENCOUNTER — Other Ambulatory Visit: Payer: Self-pay | Admitting: Neurology

## 2019-05-08 DIAGNOSIS — M6281 Muscle weakness (generalized): Secondary | ICD-10-CM | POA: Diagnosis not present

## 2019-05-08 DIAGNOSIS — R296 Repeated falls: Secondary | ICD-10-CM | POA: Diagnosis not present

## 2019-05-08 DIAGNOSIS — R2681 Unsteadiness on feet: Secondary | ICD-10-CM | POA: Diagnosis not present

## 2019-05-08 DIAGNOSIS — G20 Parkinson's disease: Secondary | ICD-10-CM | POA: Diagnosis not present

## 2019-05-08 DIAGNOSIS — R2689 Other abnormalities of gait and mobility: Secondary | ICD-10-CM | POA: Diagnosis not present

## 2019-05-10 DIAGNOSIS — G20 Parkinson's disease: Secondary | ICD-10-CM | POA: Diagnosis not present

## 2019-05-10 DIAGNOSIS — R2689 Other abnormalities of gait and mobility: Secondary | ICD-10-CM | POA: Diagnosis not present

## 2019-05-10 DIAGNOSIS — M6281 Muscle weakness (generalized): Secondary | ICD-10-CM | POA: Diagnosis not present

## 2019-05-10 DIAGNOSIS — R296 Repeated falls: Secondary | ICD-10-CM | POA: Diagnosis not present

## 2019-05-10 DIAGNOSIS — R2681 Unsteadiness on feet: Secondary | ICD-10-CM | POA: Diagnosis not present

## 2019-05-11 DIAGNOSIS — G20 Parkinson's disease: Secondary | ICD-10-CM | POA: Diagnosis not present

## 2019-05-11 DIAGNOSIS — M6281 Muscle weakness (generalized): Secondary | ICD-10-CM | POA: Diagnosis not present

## 2019-05-11 DIAGNOSIS — R2681 Unsteadiness on feet: Secondary | ICD-10-CM | POA: Diagnosis not present

## 2019-05-14 DIAGNOSIS — R2689 Other abnormalities of gait and mobility: Secondary | ICD-10-CM | POA: Diagnosis not present

## 2019-05-14 DIAGNOSIS — R296 Repeated falls: Secondary | ICD-10-CM | POA: Diagnosis not present

## 2019-05-14 DIAGNOSIS — R2681 Unsteadiness on feet: Secondary | ICD-10-CM | POA: Diagnosis not present

## 2019-05-14 DIAGNOSIS — G20 Parkinson's disease: Secondary | ICD-10-CM | POA: Diagnosis not present

## 2019-05-15 DIAGNOSIS — R2681 Unsteadiness on feet: Secondary | ICD-10-CM | POA: Diagnosis not present

## 2019-05-15 DIAGNOSIS — R1312 Dysphagia, oropharyngeal phase: Secondary | ICD-10-CM | POA: Diagnosis not present

## 2019-05-15 DIAGNOSIS — G20 Parkinson's disease: Secondary | ICD-10-CM | POA: Diagnosis not present

## 2019-05-15 DIAGNOSIS — M6281 Muscle weakness (generalized): Secondary | ICD-10-CM | POA: Diagnosis not present

## 2019-05-15 DIAGNOSIS — R41841 Cognitive communication deficit: Secondary | ICD-10-CM | POA: Diagnosis not present

## 2019-05-17 DIAGNOSIS — G20 Parkinson's disease: Secondary | ICD-10-CM | POA: Diagnosis not present

## 2019-05-17 DIAGNOSIS — R41841 Cognitive communication deficit: Secondary | ICD-10-CM | POA: Diagnosis not present

## 2019-05-17 DIAGNOSIS — R1312 Dysphagia, oropharyngeal phase: Secondary | ICD-10-CM | POA: Diagnosis not present

## 2019-05-18 DIAGNOSIS — M6281 Muscle weakness (generalized): Secondary | ICD-10-CM | POA: Diagnosis not present

## 2019-05-18 DIAGNOSIS — G20 Parkinson's disease: Secondary | ICD-10-CM | POA: Diagnosis not present

## 2019-05-18 DIAGNOSIS — R2681 Unsteadiness on feet: Secondary | ICD-10-CM | POA: Diagnosis not present

## 2019-05-21 ENCOUNTER — Telehealth: Payer: Self-pay | Admitting: Family Medicine

## 2019-05-21 ENCOUNTER — Telehealth (INDEPENDENT_AMBULATORY_CARE_PROVIDER_SITE_OTHER): Payer: Medicare HMO | Admitting: Family Medicine

## 2019-05-21 DIAGNOSIS — F0280 Dementia in other diseases classified elsewhere without behavioral disturbance: Secondary | ICD-10-CM | POA: Diagnosis not present

## 2019-05-21 DIAGNOSIS — R2681 Unsteadiness on feet: Secondary | ICD-10-CM | POA: Diagnosis not present

## 2019-05-21 DIAGNOSIS — G20 Parkinson's disease: Secondary | ICD-10-CM | POA: Diagnosis not present

## 2019-05-21 DIAGNOSIS — M6281 Muscle weakness (generalized): Secondary | ICD-10-CM | POA: Diagnosis not present

## 2019-05-21 DIAGNOSIS — R41841 Cognitive communication deficit: Secondary | ICD-10-CM | POA: Diagnosis not present

## 2019-05-21 DIAGNOSIS — R410 Disorientation, unspecified: Secondary | ICD-10-CM

## 2019-05-21 DIAGNOSIS — R296 Repeated falls: Secondary | ICD-10-CM | POA: Diagnosis not present

## 2019-05-21 DIAGNOSIS — R2689 Other abnormalities of gait and mobility: Secondary | ICD-10-CM | POA: Diagnosis not present

## 2019-05-21 DIAGNOSIS — R1312 Dysphagia, oropharyngeal phase: Secondary | ICD-10-CM | POA: Diagnosis not present

## 2019-05-21 MED ORDER — AMOXICILLIN 500 MG PO TABS: 500.0000 mg | ORAL_TABLET | Freq: Two times a day (BID) | ORAL | 0 refills | Status: AC

## 2019-05-21 NOTE — Telephone Encounter (Signed)
Gave patients daughter a cup with the patients label on it in a hazard bag

## 2019-05-21 NOTE — Telephone Encounter (Signed)
Left a message for pt daughter to come to the office and pick up a cup from the office for collecting pt urine for further testing for possible UTI

## 2019-05-21 NOTE — Progress Notes (Signed)
Virtual Visit via Video Note  I connected with Crystal Housley's sister, William Cummings on 05/21/19 at  3:30 PM EST by a video enabled telemedicine application 2/2 COVID-19 pandemic and verified that I am speaking with the correct person using two identifiers.  Location patient: home Location provider:work or home office, Sister- William Cummings Persons participating in the virtual visit: patient, provider  I discussed the limitations of evaluation and management by telemedicine and the availability of in person appointments. The patient expressed understanding and agreed to proceed.   HPI: Pt is a pleasant, soft spoken, 79 yo male with pmh sig for Dementia a/w parkinson's dz and h/o depression.  Pt is a resident at [facility].  Per pt's sister William Cummings), pt is confused.  Getting days and nights mixed up.  Sleeping during the day, up at night.  Stumbling more when walking.  Uses a walker, but at times walks too fast and the "walker gets away from him".  Pt does not drink much fluids or water.  May have lemonade and tea with dinner.  Has water in his refrigerator but forgets it is there.    [name] who has power of attorney is out of the country in [place].  [facility] does not have night time staff.  Pt has been found down beside the bed in the am on several occasions.    ROS: See pertinent positives and negatives per HPI.  Past Medical History:  Diagnosis Date  . Depression   . Parkinson's disease (HCC)     No past surgical history on file.  Family History  Problem Relation Age of Onset  . Heart attack Father   . Cancer Sister   . Early death Sister   . Depression Brother      Current Outpatient Medications:  .  carbidopa-levodopa (SINEMET CR) 50-200 MG tablet, Take 1 tablet by mouth at bedtime., Disp: 90 tablet, Rfl: 1 .  carbidopa-levodopa (SINEMET IR) 25-100 MG tablet, TAKE 2 TABLETS IN THE MORNING, 1 TABLET IN THE AFTERNOON AND 2 TABLETS IN THE EVENING, Disp: 450 tablet, Rfl: 0 .   sertraline (ZOLOFT) 100 MG tablet, TAKE 2 TABLETS BY MOUTH EVERY DAY (Patient taking differently: Take 200 mg by mouth daily. ), Disp: 180 tablet, Rfl: 1  EXAM:  VITALS per patient if applicable:    GENERAL: alert, oriented, appears well and in no acute distress  HEENT: atraumatic, conjunctiva clear, no obvious abnormalities on inspection of external nose and ears  NECK: normal movements of the head and neck  LUNGS: on inspection no signs of respiratory distress, breathing rate appears normal, no obvious gross SOB, gasping or wheezing  CV: no obvious cyanosis  MS: moves all visible extremities without noticeable abnormality  PSYCH/NEURO: pleasant and cooperative, no obvious depression or anxiety, speech and thought processing grossly intact  ASSESSMENT AND PLAN:  Discussed the following assessment and plan:  Confusion  -Possibly 2/2 UTI or other infection -will attempt to obtain Urine specimen from pt.  [name] picked up sterile container from clinic. -discussed increasing po intake of water and fluids. -Rx for amoxicillin written.  Will attempt to obtain urine specimen prior to starting abx. -pt's sister to stay with pt this evening.  Discussed SNF.  [name] will email pt's son (POA) to update him. -given strict precautions for worsening symptoms - Plan: POCT urinalysis dipstick, Urine Culture, amoxicillin (AMOXIL) 500 MG tablet  Dementia associated with Parkinson's disease (HCC) -continue sinemet 50-200 mg in am, 25-100 mg in afternoon,  50-200 mg in evening, and 50-200 mg at bedtime -continue f/u with Neurology, Dr. Carles Collet  F/u prn  I discussed the assessment and treatment plan with the patient. The patient was provided an opportunity to ask questions and all were answered. The patient agreed with the plan and demonstrated an understanding of the instructions.   The patient was advised to call back or seek an in-person evaluation if the symptoms worsen or if the  condition fails to improve as anticipated.  I provided 22 minutes of non-face-to-face time during this encounter.   [name], MD

## 2019-05-21 NOTE — Telephone Encounter (Signed)
Spoke with pt sister state that pt is not able to give urine sample today state that she will try collect sample in the morning and drop it off to the office.

## 2019-05-21 NOTE — Telephone Encounter (Signed)
The patient is a resident at  [facility]  assisted living.   pt has developed a UTI.  The pt daughter [name] would like to know if she should bring  the patient in or she come into the office and get a urine specimen cup and  bring and bring the  urine. back  please advise  call back number is  [phone]

## 2019-05-22 ENCOUNTER — Telehealth: Payer: Self-pay | Admitting: Family Medicine

## 2019-05-22 DIAGNOSIS — R2681 Unsteadiness on feet: Secondary | ICD-10-CM | POA: Diagnosis not present

## 2019-05-22 DIAGNOSIS — G20 Parkinson's disease: Secondary | ICD-10-CM | POA: Diagnosis not present

## 2019-05-22 DIAGNOSIS — R2689 Other abnormalities of gait and mobility: Secondary | ICD-10-CM | POA: Diagnosis not present

## 2019-05-22 DIAGNOSIS — R296 Repeated falls: Secondary | ICD-10-CM | POA: Diagnosis not present

## 2019-05-22 LAB — POCT URINALYSIS DIPSTICK
Bilirubin, UA: NEGATIVE
Blood, UA: NEGATIVE
Glucose, UA: NEGATIVE
Ketones, UA: NEGATIVE
Leukocytes, UA: NEGATIVE
Nitrite, UA: NEGATIVE
Protein, UA: NEGATIVE
Spec Grav, UA: 1.025 (ref 1.010–1.025)
Urobilinogen, UA: 0.2 E.U./dL
pH, UA: 6 (ref 5.0–8.0)

## 2019-05-22 NOTE — Telephone Encounter (Signed)
[name], Rozell Theiler, called in to see if the results for her brothers pee sample that was brought in earlier today. [name] can be contacted at [phone] when the results are in

## 2019-05-22 NOTE — Addendum Note (Signed)
Addended by: Carola Rhine on: 05/22/2019 04:20 PM   Modules accepted: Orders

## 2019-05-23 DIAGNOSIS — M6281 Muscle weakness (generalized): Secondary | ICD-10-CM | POA: Diagnosis not present

## 2019-05-23 DIAGNOSIS — G20 Parkinson's disease: Secondary | ICD-10-CM | POA: Diagnosis not present

## 2019-05-23 DIAGNOSIS — R2681 Unsteadiness on feet: Secondary | ICD-10-CM | POA: Diagnosis not present

## 2019-05-23 NOTE — Telephone Encounter (Signed)
Spoke with pt sister verbalized understanding of pt urine test results, Pt sister scheduled f/u in office visit for tomorrow at 2.30 pm per Dr Salomon Fick advise

## 2019-05-24 ENCOUNTER — Ambulatory Visit (INDEPENDENT_AMBULATORY_CARE_PROVIDER_SITE_OTHER): Payer: Medicare HMO | Admitting: Family Medicine

## 2019-05-24 ENCOUNTER — Ambulatory Visit (INDEPENDENT_AMBULATORY_CARE_PROVIDER_SITE_OTHER): Payer: Medicare HMO

## 2019-05-24 ENCOUNTER — Other Ambulatory Visit: Payer: Self-pay

## 2019-05-24 VITALS — Temp 98.7°F

## 2019-05-24 DIAGNOSIS — R410 Disorientation, unspecified: Secondary | ICD-10-CM

## 2019-05-24 DIAGNOSIS — D696 Thrombocytopenia, unspecified: Secondary | ICD-10-CM | POA: Diagnosis not present

## 2019-05-24 DIAGNOSIS — G20 Parkinson's disease: Secondary | ICD-10-CM | POA: Diagnosis not present

## 2019-05-24 DIAGNOSIS — F0280 Dementia in other diseases classified elsewhere without behavioral disturbance: Secondary | ICD-10-CM | POA: Diagnosis not present

## 2019-05-24 DIAGNOSIS — D72810 Lymphocytopenia: Secondary | ICD-10-CM | POA: Diagnosis not present

## 2019-05-24 LAB — CBC WITH DIFFERENTIAL/PLATELET
Basophils Absolute: 0 10*3/uL (ref 0.0–0.1)
Basophils Relative: 0.4 % (ref 0.0–3.0)
Eosinophils Absolute: 0 10*3/uL (ref 0.0–0.7)
Eosinophils Relative: 0.3 % (ref 0.0–5.0)
HCT: 41.8 % (ref 39.0–52.0)
Hemoglobin: 14.3 g/dL (ref 13.0–17.0)
Lymphocytes Relative: 26.6 % (ref 12.0–46.0)
Lymphs Abs: 0.8 10*3/uL (ref 0.7–4.0)
MCHC: 34.3 g/dL (ref 30.0–36.0)
MCV: 91.2 fl (ref 78.0–100.0)
Monocytes Absolute: 0.6 10*3/uL (ref 0.1–1.0)
Monocytes Relative: 21.6 % — ABNORMAL HIGH (ref 3.0–12.0)
Neutro Abs: 1.5 10*3/uL (ref 1.4–7.7)
Neutrophils Relative %: 51.1 % (ref 43.0–77.0)
Platelets: 137 10*3/uL — ABNORMAL LOW (ref 150.0–400.0)
RBC: 4.58 Mil/uL (ref 4.22–5.81)
RDW: 13.4 % (ref 11.5–15.5)
WBC: 2.9 10*3/uL — ABNORMAL LOW (ref 4.0–10.5)

## 2019-05-24 LAB — COMPREHENSIVE METABOLIC PANEL
ALT: 6 U/L (ref 0–53)
AST: 45 U/L — ABNORMAL HIGH (ref 0–37)
Albumin: 4.1 g/dL (ref 3.5–5.2)
Alkaline Phosphatase: 71 U/L (ref 39–117)
BUN: 21 mg/dL (ref 6–23)
CO2: 27 mEq/L (ref 19–32)
Calcium: 8.7 mg/dL (ref 8.4–10.5)
Chloride: 98 mEq/L (ref 96–112)
Creatinine, Ser: 0.81 mg/dL (ref 0.40–1.50)
GFR: 110.97 mL/min (ref >60.00)
Glucose, Bld: 80 mg/dL (ref 70–99)
Potassium: 3.9 mEq/L (ref 3.5–5.1)
Sodium: 132 mEq/L — ABNORMAL LOW (ref 135–145)
Total Bilirubin: 0.7 mg/dL (ref 0.2–1.2)
Total Protein: 6.5 g/dL (ref 6.0–8.3)

## 2019-05-24 LAB — CK: Total CK: 576 U/L — ABNORMAL HIGH (ref 7–232)

## 2019-05-24 NOTE — Progress Notes (Signed)
Subjective:    Patient ID: William Cummings, male    DOB: 06-Jun-1940, 79 y.o.   MRN: [id number]  No chief complaint on file.   HPI Patient was seen today for f/u.  Pt accompanied by his sister for f/u and labs.  Hx provided by pt's sister 2/2 pt's h/o dementia.  Pt seen for increased confusion 1/25.  An rx for Amoxicillin was sent in while awaiting pt's urine sample.  UA was ultimately negative and pt has not started the abx.  Pt on sinemet for Parkinson's dz.  Has an appt next wk with Dr. Arbutus Leas, Neurology.  [name] is in contact with pt's son, [name], who lives in [place].  [name] will not be able to come stateside until May/June 2021.  Past Medical History:  Diagnosis Date  . Depression   . Parkinson's disease (HCC)     No Known Allergies  ROS Unable to assess 2/2 dementia    Objective:    Temperature 98.7 F (37.1 C), temperature source Oral.  Gen. Pleasant, quiet/soft spoken, well-nourished, in no distress, normal affect  HEENT: [place]/AT, face symmetric, conjunctiva clear, no scleral icterus, PERRLA, EOMI, nares patent without drainage Lungs: no accessory muscle use, initial faint transmitted upper airway noise, CTAB, no wheezes or rales Cardiovascular: RRR, no m/r/g, no peripheral edema Abdomen: BS present, soft, NT/ND Musculoskeletal: No deformities, no cyanosis or clubbing, normal tone Neuro:  A&Ox3, CN II-XII intact, slowed gait with cogwheel rigidity Skin:  Warm, dry, no rash or ecchymosis.  Healing abrasion of R elbow with eschar in place.  Wt Readings from Last 3 Encounters:  05/29/18 147 lb (66.7 kg)  05/19/18 140 lb (63.5 kg)  02/07/18 154 lb (69.9 kg)    Lab Results  Component Value Date   WBC 9.7 03/18/2019   HGB 15.2 03/18/2019   HCT 46.2 03/18/2019   PLT 191 03/18/2019   GLUCOSE 114 (H) 03/18/2019   NA 139 03/18/2019   K 3.4 (L) 03/18/2019   CL 108 03/18/2019   CREATININE 0.87 03/18/2019   BUN 26 (H) 03/18/2019   CO2 22 03/18/2019     Assessment/Plan:  Confusion -continue to evaluate for signs of infection -has not started rx for amoxicillin.  Sent in as a wait and see rx while trying to obtain urine sample on 05/21/19 -As UA normal will obtain labs and CXR  -given precautions  - Plan: DG Chest 2 View, CBC with Differential/Platelet, Comprehensive metabolic panel, CK  Dementia associated with Parkinson's disease (HCC) -appears to be progressing. -discussed SNF/memory care placement with pt's sister.    Thrombocytopenia (HCC)  -noted on CBC 05/24/19.  plts 137 -consider folate and B12 def, however no anemia noted on CBC. TTP less likely. - Plan: Ambulatory referral to Hematology  Lymphocytopenia  -WBC 2.9 -of note monocytosis noted (21.6) - Plan: Ambulatory referral to Hematology  F/u prn  Abbe Amsterdam, MD

## 2019-05-25 ENCOUNTER — Telehealth: Payer: Self-pay

## 2019-05-25 ENCOUNTER — Encounter: Payer: Self-pay | Admitting: Family Medicine

## 2019-05-25 DIAGNOSIS — Z20828 Contact with and (suspected) exposure to other viral communicable diseases: Secondary | ICD-10-CM | POA: Diagnosis not present

## 2019-05-25 DIAGNOSIS — Z03818 Encounter for observation for suspected exposure to other biological agents ruled out: Secondary | ICD-10-CM | POA: Diagnosis not present

## 2019-05-25 NOTE — Telephone Encounter (Signed)
Pt sister picked up pt copy of  lab results from the office

## 2019-05-28 ENCOUNTER — Telehealth: Payer: Self-pay | Admitting: Family Medicine

## 2019-05-28 ENCOUNTER — Emergency Department (HOSPITAL_COMMUNITY): Payer: Medicare HMO

## 2019-05-28 ENCOUNTER — Encounter (HOSPITAL_COMMUNITY): Payer: Self-pay | Admitting: Emergency Medicine

## 2019-05-28 ENCOUNTER — Emergency Department (HOSPITAL_COMMUNITY)
Admission: EM | Admit: 2019-05-28 | Discharge: 2019-05-30 | Disposition: A | Discharge status: Skilled Nursing Facility | Payer: Medicare HMO | Attending: Emergency Medicine | Admitting: Emergency Medicine

## 2019-05-28 ENCOUNTER — Other Ambulatory Visit: Payer: Self-pay

## 2019-05-28 ENCOUNTER — Telehealth: Payer: Self-pay | Admitting: Neurology

## 2019-05-28 DIAGNOSIS — R296 Repeated falls: Secondary | ICD-10-CM | POA: Diagnosis not present

## 2019-05-28 DIAGNOSIS — N5089 Other specified disorders of the male genital organs: Secondary | ICD-10-CM | POA: Insufficient documentation

## 2019-05-28 DIAGNOSIS — R531 Weakness: Secondary | ICD-10-CM | POA: Diagnosis not present

## 2019-05-28 DIAGNOSIS — U071 COVID-19: Secondary | ICD-10-CM | POA: Insufficient documentation

## 2019-05-28 DIAGNOSIS — R4182 Altered mental status, unspecified: Secondary | ICD-10-CM | POA: Diagnosis not present

## 2019-05-28 DIAGNOSIS — G20 Parkinson's disease: Secondary | ICD-10-CM | POA: Insufficient documentation

## 2019-05-28 DIAGNOSIS — K4090 Unilateral inguinal hernia, without obstruction or gangrene, not specified as recurrent: Secondary | ICD-10-CM | POA: Diagnosis not present

## 2019-05-28 DIAGNOSIS — I10 Essential (primary) hypertension: Secondary | ICD-10-CM | POA: Diagnosis not present

## 2019-05-28 DIAGNOSIS — R404 Transient alteration of awareness: Secondary | ICD-10-CM | POA: Diagnosis not present

## 2019-05-28 LAB — URINALYSIS, ROUTINE W REFLEX MICROSCOPIC
Bilirubin Urine: NEGATIVE
Glucose, UA: NEGATIVE mg/dL
Hgb urine dipstick: NEGATIVE
Ketones, ur: 20 mg/dL — AB
Nitrite: NEGATIVE
Protein, ur: NEGATIVE mg/dL
Specific Gravity, Urine: 1.023 (ref 1.005–1.030)
pH: 6 (ref 5.0–8.0)

## 2019-05-28 LAB — COMPREHENSIVE METABOLIC PANEL
ALT: 53 U/L — ABNORMAL HIGH (ref 0–44)
AST: 43 U/L — ABNORMAL HIGH (ref 15–41)
Albumin: 4.1 g/dL (ref 3.5–5.0)
Alkaline Phosphatase: 65 U/L (ref 38–126)
Anion gap: 13 (ref 5–15)
BUN: 25 mg/dL — ABNORMAL HIGH (ref 8–23)
CO2: 23 mmol/L (ref 22–32)
Calcium: 9 mg/dL (ref 8.9–10.3)
Chloride: 100 mmol/L (ref 98–111)
Creatinine, Ser: 0.78 mg/dL (ref 0.61–1.24)
GFR calc Af Amer: >60 mL/min (ref >60)
GFR calc non Af Amer: >60 mL/min (ref >60)
Glucose, Bld: 91 mg/dL (ref 70–99)
Potassium: 3.8 mmol/L (ref 3.5–5.1)
Sodium: 136 mmol/L (ref 135–145)
Total Bilirubin: 1.3 mg/dL — ABNORMAL HIGH (ref 0.3–1.2)
Total Protein: 7.4 g/dL (ref 6.5–8.1)

## 2019-05-28 LAB — CBC
HCT: 43.6 % (ref 39.0–52.0)
Hemoglobin: 14.7 g/dL (ref 13.0–17.0)
MCH: 30.8 pg (ref 26.0–34.0)
MCHC: 33.7 g/dL (ref 30.0–36.0)
MCV: 91.2 fL (ref 80.0–100.0)
Platelets: 189 10*3/uL (ref 150–400)
RBC: 4.78 MIL/uL (ref 4.22–5.81)
RDW: 12.7 % (ref 11.5–15.5)
WBC: 3.9 10*3/uL — ABNORMAL LOW (ref 4.0–10.5)
nRBC: 0 % (ref 0.0–0.2)

## 2019-05-28 LAB — RESPIRATORY PANEL BY RT PCR (FLU A&B, COVID)
Influenza A by PCR: NEGATIVE
Influenza B by PCR: NEGATIVE
SARS Coronavirus 2 by RT PCR: POSITIVE — AB

## 2019-05-28 MED ORDER — SODIUM CHLORIDE (PF) 0.9 % IJ SOLN
INTRAMUSCULAR | Status: AC
  Filled 2019-05-28: qty 50

## 2019-05-28 MED ORDER — CARBIDOPA-LEVODOPA ER 50-200 MG PO TBCR
1.0000 | EXTENDED_RELEASE_TABLET | Freq: Every day | ORAL | Status: DC
  Administered 2019-05-28 – 2019-05-30 (×3): 1 via ORAL
  Filled 2019-05-28 (×4): qty 1

## 2019-05-28 MED ORDER — IOHEXOL 350 MG/ML SOLN
100.0000 mL | Freq: Once | INTRAVENOUS | Status: AC | PRN
  Administered 2019-05-28: 20:00:00 100 mL via INTRAVENOUS

## 2019-05-28 MED ORDER — CARBIDOPA-LEVODOPA 25-250 MG PO TABS
1.0000 | ORAL_TABLET | Freq: Every day | ORAL | Status: DC
  Administered 2019-05-29 – 2019-05-30 (×2): 1 via ORAL
  Filled 2019-05-28 (×2): qty 1

## 2019-05-28 MED ORDER — CARBIDOPA-LEVODOPA 25-100 MG PO TABS
2.0000 | ORAL_TABLET | Freq: Two times a day (BID) | ORAL | Status: DC
  Administered 2019-05-29 – 2019-05-30 (×4): 2 via ORAL
  Filled 2019-05-28 (×5): qty 2

## 2019-05-28 MED ORDER — SERTRALINE HCL 50 MG PO TABS
200.0000 mg | ORAL_TABLET | Freq: Every day | ORAL | Status: DC
  Administered 2019-05-28 – 2019-05-30 (×3): 200 mg via ORAL
  Filled 2019-05-28 (×3): qty 4

## 2019-05-28 NOTE — ED Triage Notes (Addendum)
Patient arrived by EMS from [facility].   EMS statement "facility is unable to keep patient due to COVID positive status. He need's to find placement elsewhere due to this."  Facility stated patient has weakness.    Patient c/o ABD pain.   Scrotal swelling present upon assessment of abdomin and perineal area. NT Myriam Jacobson present when this Clinical research associate assessed.    Patient has history of Dementia. Alert and Oriented x 2 per EMS.

## 2019-05-28 NOTE — TOC Initial Note (Signed)
Transition of Care [facility]) - Initial/Assessment Note    Patient Details  Name: William Cummings MRN: [id number] Date of Birth: 1940/06/14  Transition [facility] Care [facility]) CM/SW Contact:    Mercy Riding, LCSW Phone Number: 05/28/2019, 9:29 PM  Clinical Narrative:  CSW spoke to EDP who states pt needs placement for safety.  CSW spoke to pt;'s daughter who stated pt had an aide that would come in approx 3 hours a day to, "help him get up and go", but due to the pt having COVID and pt not being to utilize an aide to his COVID positive status pt's daughter feels rehab may be able to "get him back on his feet" sufficiently to get him back home.  Pt's daughter stated [facility] is a preference since it is near where family/pt lived growing up, but is agreeable to pt going to first available facility(s) and pt's daughter understands that while placement may not happen if it does pt will have to go to first available if necessary.  Pt's daughter understands visitors are not allowed at this time at [facility].                 Expected Discharge Plan: Skilled Nursing Facility Barriers to Discharge: Insurance Authorization(Pt's daughter states pt ha dementia but goal is to get pt back to his ILF post-rehab.)   Patient Goals and CMS Choice Patient states their goals for this hospitalization and ongoing recovery are:: (To rehan sufficiently to return to pt's ILF, per pt's daughter)      Expected Discharge Plan and Services Expected Discharge Plan: Skilled Nursing Facility       Living arrangements for the past 2 months: Independent Living Facility                                      Prior Living Arrangements/Services Living arrangements for the past 2 months: Independent Living Facility Lives with:: Self Patient language and need for interpreter reviewed:: No Do you feel safe going back to the place where you live?: No      Need for Family Participation in Patient Care: Yes (Comment) Care  giver support system in place?: No (comment)      Activities of Daily Living      Permission Sought/Granted Permission sought to share information with : Facility Industrial/product designer granted to share information with : Yes, Verbal Permission Granted              Emotional Assessment       Orientation: : Oriented to Self, Oriented to Place      Admission diagnosis:  covid positive with weakness Patient Active Problem List   Diagnosis Date Noted  . Dementia associated with Parkinson's disease (HCC) 10/09/2018  . Parkinson's disease (HCC) 08/03/2017  . Depression, recurrent (HCC) 08/03/2017   PCP:  Deeann Saint, MD Pharmacy:   [facility] [phone] Ginette Otto,  - [address] [place] [address] Phone: [phone] Fax: [phone]     Social Determinants of Health (SDOH) Interventions    Readmission Risk Interventions No flowsheet data found.

## 2019-05-28 NOTE — Progress Notes (Signed)
Assessment completed; PT consult placed, FL-2 signed - referrals sent out via the hub.  Family prefers [facility] SN but agreeable to first available.  2nd shift ED CSW will leave handoff for 1st shift ED CSW.  CSW will continue to follow for D/C needs.  Dorothe Pea. Adain Geurin, LCSW, LCAS, CSI Transitions of Care Clinical Social Worker Care Coordination Department Ph: [phone]

## 2019-05-28 NOTE — NC FL2 (Signed)
  [place] MEDICAID FL2 LEVEL OF CARE SCREENING TOOL     IDENTIFICATION  Patient Name: William Cummings Birthdate: July 01, 1940 Sex: male Admission Date (Current Location): 05/28/2019  [facility] and [place] Number:  Producer, television/film/video and Address:  [facility],  [address] [place], [place] [address]      Provider Number: [id number]  Attending Physician Name and Address:  Tilden Fossa, MD  Relative Name and Phone Number:       Current Level of Care: Hospital Recommended Level of Care: Skilled Nursing Facility Prior Approval Number:    Date Approved/Denied:   PASRR Number:    Discharge Plan: SNF    Current Diagnoses: Patient Active Problem List   Diagnosis Date Noted  . Dementia associated with Parkinson's disease (HCC) 10/09/2018  . Parkinson's disease (HCC) 08/03/2017  . Depression, recurrent (HCC) 08/03/2017    Orientation RESPIRATION BLADDER Height & Weight     Self  Normal Incontinent Weight: 147 lb 0.8 oz (66.7 kg) Height:     BEHAVIORAL SYMPTOMS/MOOD NEUROLOGICAL BOWEL NUTRITION STATUS      Incontinent Diet  AMBULATORY STATUS COMMUNICATION OF NEEDS Skin   Extensive Assist Non-Verbally (Pt has a skin tear on pt's right calf approx 1/2 dollar-sized)                       Personal Care Assistance Level of Assistance  Bathing, Dressing Bathing Assistance: Limited assistance   Dressing Assistance: Limited assistance     Functional Limitations Info             SPECIAL CARE FACTORS FREQUENCY  PT (By licensed PT), OT (By licensed OT)     PT Frequency: 5 OT Frequency: 5            Contractures Contractures Info: Not present    Additional Factors Info  Code Status, Allergies Code Status Info: Not on file Allergies Info: No Known Allergies           Current Medications (05/28/2019):  This is the current hospital active medication list Current Facility-Administered Medications  Medication Dose Route Frequency Provider Last  Rate Last Admin  . sodium chloride (PF) 0.9 % injection            Current Outpatient Medications  Medication Sig Dispense Refill  . amoxicillin (AMOXIL) 500 MG tablet Take 1 tablet (500 mg total) by mouth 2 (two) times daily for 7 days. 14 tablet 0  . carbidopa-levodopa (SINEMET CR) 50-200 MG tablet Take 1 tablet by mouth at bedtime. 90 tablet 1  . carbidopa-levodopa (SINEMET IR) 25-100 MG tablet TAKE 2 TABLETS IN THE MORNING, 1 TABLET IN THE AFTERNOON AND 2 TABLETS IN THE EVENING (Patient taking differently: See admin instructions. TAKE 2 TABLETS IN THE MORNING, 1 TABLET IN THE AFTERNOON AND 2 TABLETS IN THE EVENING) 450 tablet 0  . sertraline (ZOLOFT) 100 MG tablet TAKE 2 TABLETS BY MOUTH EVERY DAY (Patient taking differently: Take 200 mg by mouth daily. ) 180 tablet 1     Discharge Medications: Please see discharge summary for a list of discharge medications.  Relevant Imaging Results:  Relevant Lab Results:   Additional Information [id number]  Dorothe Pea Alfreda Hammad, LCSW

## 2019-05-28 NOTE — Telephone Encounter (Signed)
William Cummings called in and stated pt tested positive for COVID on 05/25/19. [facility] suggested that his provider refer him to [facility] for Rehab b/c the pt is unable to do basic necessities for the day (dressing, eating, etc)  [name] can be reached at [phone]

## 2019-05-28 NOTE — ED Notes (Signed)
Pt able to take medications crushed in apple sauce without incident.

## 2019-05-28 NOTE — Progress Notes (Signed)
Consult request has been received. CSW attempting to follow up at present time.  CSW spoke to EDP who stated pt's lab results and CT results were still pending.  Per notes on 1/26 pt's sister picked up results of UTI testing.  EDP to update CSW of UTI results as pt has dementia.  Per EDP, pt had an aide assisting in the ILF home of the the pt but due to COVID the PCA (aide) can't come into the home to care for the pt.  CSW will continue to follow for D/C needs.  Dorothe Pea. Damiean Lukes, LCSW, LCAS, CSI Transitions of Care Clinical Social Worker Care Coordination Department Ph: [phone]

## 2019-05-28 NOTE — ED Provider Notes (Signed)
[facility] Emergency Department Provider Note MRN:  [id number]  Arrival date & time: 05/28/19     Chief Complaint   Weakness   History of Present Illness   William Cummings is a 79 y.o. year-old male with a history of dementia, Parkinson's disease presenting to the ED with chief complaint of weakness.  General weakness for several days.  Patient has dementia and is unclear about recent events.  Reportedly brought here because his care facility is unable to care for COVID-19 patients.  I was unable to obtain an accurate HPI, PMH, or ROS due to the patient's dementia.  Level 5 caveat.  Review of Systems  Positive for weakness.  Patient's Health History    Past Medical History:  Diagnosis Date  . Depression   . Parkinson's disease ([place])     History reviewed. No pertinent surgical history.  Family History  Problem Relation Age of Onset  . Heart attack Father   . Cancer Sister   . Early death Sister   . Depression Brother     Social History   Socioeconomic History  . Marital status: Divorced    Spouse name: Not on file  . Number of children: Not on file  . Years of education: Not on file  . Highest education level: Not on file  Occupational History  . Occupation: Retired    Comment: Atty.  Tobacco Use  . Smoking status: Never Smoker  . Smokeless tobacco: Never Used  Substance and Sexual Activity  . Alcohol use: Yes    Alcohol/week: 0.0 standard drinks    Comment: glass of wine once every couple months  . Drug use: No  . Sexual activity: Never  Other Topics Concern  . Not on file  Social History Narrative  . Not on file   Social Determinants of Health   Financial Resource Strain:   . Difficulty of Paying Living Expenses: Not on file  Food Insecurity:   . Worried About Charity fundraiser in the Last Year: Not on file  . Ran Out of Food in the Last Year: Not on file  Transportation Needs:   . Lack of Transportation (Medical):  Not on file  . Lack of Transportation (Non-Medical): Not on file  Physical Activity:   . Days of Exercise per Week: Not on file  . Minutes of Exercise per Session: Not on file  Stress:   . Feeling of Stress : Not on file  Social Connections:   . Frequency of Communication with Friends and Family: Not on file  . Frequency of Social Gatherings with Friends and Family: Not on file  . Attends Religious Services: Not on file  . Active Member of Clubs or Organizations: Not on file  . Attends Archivist Meetings: Not on file  . Marital Status: Not on file  Intimate Partner Violence:   . Fear of Current or Ex-Partner: Not on file  . Emotionally Abused: Not on file  . Physically Abused: Not on file  . Sexually Abused: Not on file     Physical Exam   Vitals:   05/28/19 1412 05/28/19 1430  BP: (!) 172/87 130/68  Pulse: 67 (!) 57  Resp: (!) 28 (!) 29  Temp: 97.8 F (36.6 C)   SpO2: 100% 99%    CONSTITUTIONAL: Chronically ill-appearing, NAD NEURO:  Alert and oriented x 3, no focal deficits EYES:  eyes equal and reactive ENT/NECK:  no LAD, no JVD CARDIO: Regular rate, well-perfused,  normal S1 and S2 PULM:  CTAB no wheezing or rhonchi GI/GU:  normal bowel sounds, non-distended, non-tender MSK/SPINE:  No gross deformities, no edema SKIN:  no rash, atraumatic PSYCH:  Appropriate speech and behavior  *Additional and/or pertinent findings included in MDM below  Diagnostic and Interventional Summary    EKG Interpretation  Date/Time:    Ventricular Rate:    PR Interval:    QRS Duration:   QT Interval:    QTC Calculation:   R Axis:     Text Interpretation:        Cardiac Monitoring Interpretation:  Labs Reviewed  CBC - Abnormal; Notable for the following components:      Result Value   WBC 3.9 (*)    All other components within normal limits  COMPREHENSIVE METABOLIC PANEL - Abnormal; Notable for the following components:   BUN 25 (*)    AST 43 (*)    ALT 53  (*)    Total Bilirubin 1.3 (*)    All other components within normal limits  RESPIRATORY PANEL BY RT PCR (FLU A&B, COVID)  URINALYSIS, ROUTINE W REFLEX MICROSCOPIC    DG Chest Port 1 View  Final Result    CT ABDOMEN PELVIS W CONTRAST    (Results Pending)    Medications - No data to display   Procedures  /  Critical Care Procedures  ED Course and Medical Decision Making  I have reviewed the triage vital signs, the nursing notes, and pertinent available records from the EMR.  Pertinent labs & imaging results that were available during my care of the patient were reviewed by me and considered in my medical decision making (see below for details).     Reportedly positive for COVID-19, complaining of weakness, however vital signs are reassuring, no hypoxia, hemodynamically stable, there is a documented respiratory rate of 29 but on my evaluation respiratory rate is 18.  Will obtain a few screening labs, x-ray, will consult case management.  3:15 PM update: Spoke with patient's sister who was able to provide added information.  [facility] and they have a Hospital doctor that has been helping the patient do his day-to-day activities.  Now that he is Covid positive, they are not allowing him to have this assistance and therefore he is not appropriate for this facility.  4:40 PM update: Upon review of nursing documentation, there was concern for scrotal swelling and abdominal pain when patient first arrived.  I was able to reevaluate and reexamine the patient with a genitourinary exam.  Patient seems to have a large inguinal hernia on the right extending into the scrotum.  Chronicity unknown.  There is no skin change, it seems to be at least partly reducible but it is very large.  Will CT to exclude incarceration or SBO.  Also awaiting urinalysis.  Without indication for admission, patient would need to await placement here in the emergency department.   Case management is working on it.  Signed out to oncoming provider at shift change.  Elmer Sow. Pilar Plate, MD [facility] Emergency Medicine [facility] [email or web address]  Final Clinical Impressions(s) / ED Diagnoses     ICD-10-CM   1. COVID-19  U07.1     ED Discharge Orders    None       Discharge Instructions Discussed with and Provided to Patient:   Discharge Instructions   None       Sabas Sous, MD 05/28/19 1702

## 2019-05-28 NOTE — ED Notes (Signed)
Entered pt's room and saw that his wires were everywhere, his gown was halfway off, and he took off the male purewick and wet the bed.  This Clinical research associate and another tech changed the bed, untangled the wires, and put a new gown on the patient,  This Clinical research associate also put a condom cath on the pt along with a brief.

## 2019-05-28 NOTE — Telephone Encounter (Signed)
Patient's wife William Cummings called regarding [facility] and him being in his room all day in his chair at the facility and was found in the same spot today. He cannot get up. His wife would like to get an Authorization for him to start Rehab again? When COVID started she said everything stopped.  She said he is currently being transported to [facility] because he has tested positive for COVID. Please Call. Thank you

## 2019-05-28 NOTE — ED Provider Notes (Signed)
Patient care assumed at 1630. Patient referred from. [facility] for COVID positive status, cannot return due to needing a caregiver and caregiver is no longer allowed due to the patient's COVID status.  CT scan demonstrates hernia without evidence of incarceration. UA is not consistent with UTI. Will plan for placement via social work. PT and OT consults placed.   Tilden Fossa, MD 05/28/19 [phone]

## 2019-05-28 NOTE — Telephone Encounter (Signed)
FYI

## 2019-05-28 NOTE — Progress Notes (Signed)
CSW spoke to EDP who states pt needs placement for safety.  CSW spoke to pt;'s daughter who stated pt had an aide that would come in approx 3 hours a day to, "help him get up and go", but due to the pt having COVID and pt not being to utilize an aide to his COVID positive status pt's daughter feels rehab may be able to "get him back on his feet" sufficiently to get him back home.  Pt's daughter stated [facility] is a preference since it is near where family/pt lived growing up, but is agreeable to pt going to first available facility(s) and pt's daughter understands that while placement may not happen if it does pt will have to go to first available if necessary.  Pt's daughter understands visitors are not allowed at this time at [facility].  CSW will continue to follow for D/C needs.  Dorothe Pea. Camdynn Maranto, LCSW, LCAS, CSI Transitions of Care Clinical Social Worker Care Coordination Department Ph: [phone]

## 2019-05-29 ENCOUNTER — Encounter: Payer: Self-pay | Admitting: Oncology

## 2019-05-29 LAB — CBG MONITORING, ED: Glucose-Capillary: 95 mg/dL (ref 70–99)

## 2019-05-29 LAB — URINE CULTURE: Culture: NO GROWTH

## 2019-05-29 NOTE — ED Provider Notes (Signed)
Blood pressure (!) 182/86, pulse 64, temperature 97.8 F (36.6 C), temperature source Oral, resp. rate (!) 23, weight 66.7 kg, SpO2 100 %.   In short, William Cummings is a 79 y.o. male with a chief complaint of Weakness .  Refer to the original H&P for additional details.  Awaiting placement evaluation. Home meds ordered yesterday. No acute events overnight.     Maia Plan, MD 05/29/19 1021

## 2019-05-29 NOTE — ED Notes (Signed)
Called PT office and Annabell Howells is signed up for this pt. The office will send her a message telling her it's stat. Pager [phone].

## 2019-05-29 NOTE — Progress Notes (Signed)
Pt son, Arlys John called from [place] to speak to patient and receive an update. Number for Arlys John [facility] code [phone]

## 2019-05-29 NOTE — ED Provider Notes (Signed)
Pt signed out by Dr. Jacqulyn Bath awaiting placement.   Pt has contracted Covid and his caregiver is no longer able to come out and care for him.  He has had frequent falls and is weak.  He will need to go to a SNF for 30 days or less for rehab.  By then, he should no longer be infectious and will have gained some strength.   Jacalyn Lefevre, MD 05/29/19 Zollie Pee

## 2019-05-29 NOTE — Progress Notes (Signed)
Noted plan for SNF. Spoke with SW- will defer OT to SNF.  Lise Auer, OT Acute Rehabilitation Services [phone] Office- [phone]

## 2019-05-29 NOTE — Progress Notes (Addendum)
12:10p CSW spoke with patient's sister, William Cummings, regarding bed offers. Patient has bed offers to [facility] and [facility]. William Cummings stated that [facility] is too far and would like to see if patient could be admitted to [facility] first which is closer and if not she is agreeable to [facility]. William Cummings did not have any beds, so CSW contacted [facility]. CSW spoke with William Cummings at [place] who reports she can accept patient, but patient will need a PASSR number before she able to accept patient. Patient has a PASSR review started, but additional information is needed. Patient is NOT managed by [facility], so facility will have to obtain auth.  10:24a CSW continues to work on SNF placement. CSW awaiting patient to be seen by PT. Patient has one bed offer to [facility]. Will follow up with patient's family about this bed offer and will seek other offers once PT recommendations are documented.   William Corwin, LCSW Transitions of Care Department [facility] ED [phone]

## 2019-05-29 NOTE — Evaluation (Signed)
Physical Therapy Evaluation Patient Details Name: William Cummings MRN: [id number] DOB: [birth year] Today's Date: 05/29/2019   History of Present Illness  Pt is a 79 year old male with history of Parkinson's and depression, presented to ED due to Barnes 19 positive and unable to remain at ILF due to no caretaker.  Clinical Impression  Pt seen in ED for placement for SNF. Pt currently with functional limitations due to the deficits listed below (see PT Problem List). Pt will benefit from skilled PT to increase their independence and safety with mobility to allow discharge to the venue listed below.  Pt assisted with changing brief and bed pad (old urine present) and then able to stand EOB and take a few steps along bed with min-mod +2 assist for mobility.  Pt's SPO2 reading in 80s on room air during mobility so RN notified.  Pt would benefit from SNF upon d/c to improve independence and mobility in order to return to ILF.     Follow Up Recommendations SNF;Supervision/Assistance - 24 hour    Equipment Recommendations  None recommended by PT    Recommendations for Other Services       Precautions / Restrictions Precautions Precautions: Fall Precaution Comments: monitor sats      Mobility  Bed Mobility Overal bed mobility: Needs Assistance Bed Mobility: Supine to Sit;Sit to Supine     Supine to sit: Mod assist;+2 for safety/equipment Sit to supine: Min assist;+2 for safety/equipment   General bed mobility comments: pt attempting to assist, some difficulty from stretcher (seen in ED), assist mostly for lower body  Transfers Overall transfer level: Needs assistance Equipment used: 2 person hand held assist Transfers: Sit to/from Stand Sit to Stand: Min assist;From elevated surface         General transfer comment: verbal cues for technique, pt assisted with bil HHA, posterior lean present and pt able to briefly correct, able to take a few steps up [facility] with shuffling steps  (improved with cues for "big step"), required manual cue at hips for sitting, SPO2 not reading at times and monitor on finger so uncertain of accuracy however mostly remained in 80s on room air (RN notified) - no nasal cannula tubing in room  Ambulation/Gait                Stairs            Wheelchair Mobility    Modified Rankin (Stroke Patients Only)       Balance                                             Pertinent Vitals/Pain Pain Assessment: Faces Faces Pain Scale: No hurt Pain Intervention(s): Repositioned    Home Living Family/patient expects to be discharged to:: Skilled nursing facility     Type of Home: Independent living facility                Prior Function Level of Independence: Needs assistance   Gait / Transfers Assistance Needed: typically ambulates with RW  ADL's / Homemaking Assistance Needed: has aide 3 hrs/day        Hand Dominance        Extremity/Trunk Assessment   Upper Extremity Assessment Upper Extremity Assessment: Generalized weakness    Lower Extremity Assessment Lower Extremity Assessment: Generalized weakness    Cervical / Trunk Assessment Cervical / Trunk  Assessment: Normal  Communication   Communication: HOH  Cognition Arousal/Alertness: Awake/alert Behavior During Therapy: WFL for tasks assessed/performed Overall Cognitive Status: No family/caregiver present to determine baseline cognitive functioning                                 General Comments: follows simple commands      General Comments      Exercises     Assessment/Plan    PT Assessment Patient needs continued PT services  PT Problem List Decreased strength;Decreased balance;Decreased mobility;Decreased activity tolerance;Decreased cognition;Decreased knowledge of use of DME       PT Treatment Interventions DME instruction;Therapeutic activities;Gait training;Therapeutic exercise;Patient/family  education;Functional mobility training;Balance training;Wheelchair mobility training    PT Goals (Current goals can be found in the Care Plan section)  Acute Rehab PT Goals PT Goal Formulation: Patient unable to participate in goal setting Time For Goal Achievement: 06/12/19 Potential to Achieve Goals: Fair    Frequency Min 2X/week   Barriers to discharge        Co-evaluation               AM-PAC PT "6 Clicks" Mobility  Outcome Measure Help needed turning from your back to your side while in a flat bed without using bedrails?: A Little Help needed moving from lying on your back to sitting on the side of a flat bed without using bedrails?: A Lot Help needed moving to and from a bed to a chair (including a wheelchair)?: A Lot Help needed standing up from a chair using your arms (e.g., wheelchair or bedside chair)?: A Lot Help needed to walk in hospital room?: A Lot Help needed climbing 3-5 steps with a railing? : Total 6 Click Score: 12    End of Session Equipment Utilized During Treatment: Gait belt Activity Tolerance: Patient tolerated treatment well Patient left: with call bell/phone within reach;in bed Nurse Communication: Mobility status PT Visit Diagnosis: Other abnormalities of gait and mobility (R26.89)    Time: 1761-6073 PT Time Calculation (min) (ACUTE ONLY): 24 min   Charges:   PT Evaluation $PT Eval Moderate Complexity: 1 Mod     Kati PT, DPT Acute Rehabilitation Services Office: [phone]  Sarajane Jews 05/29/2019, 12:50 PM

## 2019-05-29 NOTE — Progress Notes (Signed)
CSW was updated by [facility] at [facility] that although there is, per [facility], no auth required through Feb 28th, "our company still requires it, especially with [employer] being so funny" when CSW asked if the auth being susended by [facility] and why the waiver can't be used.  CSW will continue to follow for D/C needs.  Dorothe Pea. Kee Drudge, LCSW, LCAS, CSI Transitions of Care Clinical Social Worker Care Coordination Department Ph: [phone]

## 2019-05-29 NOTE — Progress Notes (Addendum)
PASRR pending.  CSW uploaded and also faxed pt's clinicals to [facility] MUST as requested.  CSW updated French Ana from [facility] via email and requested for pt to go to [place] place to go as the Berkley Harvey is not needed form placement using [employer] until Feb 28th.  5:57 PM CSW received a reply from [facility] MUST requesting that provider place a progress note stating, "MD/DO signed/dated certification statement that only 30 days or less rehab is required".  CSW will continue to follow for D/C needs.  William Cummings. Regnia Mathwig, LCSW, LCAS, CSI Transitions of Care Clinical Social Worker Care Coordination Department Ph: [phone]

## 2019-05-29 NOTE — ED Notes (Signed)
Dr. Particia Nearing made aware that pt's BP ranges from 140's systolic to 170's systolic. No new BP medication orders.

## 2019-05-30 DIAGNOSIS — R531 Weakness: Secondary | ICD-10-CM | POA: Diagnosis not present

## 2019-05-30 DIAGNOSIS — M2550 Pain in unspecified joint: Secondary | ICD-10-CM | POA: Diagnosis not present

## 2019-05-30 DIAGNOSIS — U071 COVID-19: Secondary | ICD-10-CM | POA: Diagnosis not present

## 2019-05-30 DIAGNOSIS — Z7401 Bed confinement status: Secondary | ICD-10-CM | POA: Diagnosis not present

## 2019-05-30 NOTE — Progress Notes (Addendum)
CSW received a call from [place] in admission from [facility] stating pt has been accepted and can come to [facility] as soon as the AVS is provided to them so preparations can be made for the pt's arrival.  CSW updated pt's EDP so the pt's AVS can be updated/provided.  Pt's AVS will need to sent via the hub to: [phone]  CSW will continue to follow for D/C needs.  Dorothe Pea. Marquelle Balow, LCSW, LCAS, CSI Transitions of Care Clinical Social Worker Care Coordination Department Ph: [phone]

## 2019-05-30 NOTE — Progress Notes (Addendum)
CSW received a call from [place] at [facility] stating the patient has been offered a bed and has been accepted and that the pt can arrive today ASAP.  The pt's accepting doctor is SNF MD.  The room number will be [facility] Room # TBD.  The number for report is [phone].  French Ana wanted to verify pt's limited # of meds were correct and EDP stated per chart, pt is D/C'ing on only two meds ( SINEMET CR and zoloft).  CSW will update RN.  Dorothe Pea. Milynn Quirion, Theresia Majors, LCAS Clinical Social Worker Ph: [phone]

## 2019-05-30 NOTE — ED Notes (Signed)
Patient ate most of his dinner plus an applesauce with meds.  He drank and cup of water and his tea.  He is clean and dry and his condom catheter is in place.

## 2019-05-30 NOTE — Progress Notes (Signed)
CSW received an email from [facility] MUST stating pt has been assigned a "Level II" PASRR:  [id number]  Begin Date: 05/30/19  End Date:   06/29/19  CSW updated French Ana in admissions who stated she would update the social worker and the ?CSW would be given a call back.  CSW will continue to follow for D/C needs.  William Cummings. Taniesha Glanz, LCSW, LCAS, CSI Transitions of Care Clinical Social Worker Care Coordination Department Ph: [phone]

## 2019-05-30 NOTE — Progress Notes (Signed)
CSW provided pt's AVS to [facility] which is reviewing now.   CSW awaiting return call from [facility] with a # for report/room #.  CSW will continue to follow for D/C needs.  Dorothe Pea. Tahisha Hakim, LCSW, LCAS, CSI Transitions of Care Clinical Social Worker Care Coordination Department Ph: [phone]

## 2019-05-30 NOTE — Progress Notes (Signed)
Received William Cummings at shift change awake in his bed. He remained cooperative and calm. [facility] arrived and patient was transported at  2330 hrs  to [facility].

## 2019-05-30 NOTE — Progress Notes (Signed)
ED saw pt was still in the ED and confirmed with the pt's RN that the pt was still awaiting the arrival of PTAR to the ED to p/u the pt.  CSW will continue to follow for D/C needs.  William Cummings. Makeisha Jentsch, LCSW, LCAS, CSI Transitions of Care Clinical Social Worker Care Coordination Department Ph: [phone]

## 2019-05-30 NOTE — Progress Notes (Signed)
CSW continue to seeking SNF placement. Patient has accepted [facility]. Awaiting on PASSR number from [place]. Patient's sister has been updated. [facility] aware PASSR number has not come through yet. Will continue to follow up.   Geralyn Corwin, LCSW Transitions of Care Department [facility] ED [phone]

## 2019-05-30 NOTE — ED Notes (Signed)
Patient is eating and drinking well.  He ate at least half of his breakfast and an extra apple sauce and drank a cup of water.  Patient is calm and cooperative and in no distress.

## 2019-05-31 ENCOUNTER — Telehealth: Payer: Medicare HMO | Admitting: Neurology

## 2019-05-31 DIAGNOSIS — R296 Repeated falls: Secondary | ICD-10-CM | POA: Diagnosis not present

## 2019-05-31 DIAGNOSIS — R488 Other symbolic dysfunctions: Secondary | ICD-10-CM | POA: Diagnosis not present

## 2019-05-31 DIAGNOSIS — G20 Parkinson's disease: Secondary | ICD-10-CM | POA: Diagnosis not present

## 2019-05-31 DIAGNOSIS — D709 Neutropenia, unspecified: Secondary | ICD-10-CM | POA: Diagnosis not present

## 2019-05-31 DIAGNOSIS — E039 Hypothyroidism, unspecified: Secondary | ICD-10-CM | POA: Diagnosis not present

## 2019-05-31 DIAGNOSIS — D530 Protein deficiency anemia: Secondary | ICD-10-CM | POA: Diagnosis not present

## 2019-05-31 DIAGNOSIS — Z8249 Family history of ischemic heart disease and other diseases of the circulatory system: Secondary | ICD-10-CM | POA: Diagnosis not present

## 2019-05-31 DIAGNOSIS — N390 Urinary tract infection, site not specified: Secondary | ICD-10-CM | POA: Diagnosis not present

## 2019-05-31 DIAGNOSIS — D696 Thrombocytopenia, unspecified: Secondary | ICD-10-CM | POA: Diagnosis not present

## 2019-05-31 DIAGNOSIS — D519 Vitamin B12 deficiency anemia, unspecified: Secondary | ICD-10-CM | POA: Diagnosis not present

## 2019-05-31 DIAGNOSIS — R278 Other lack of coordination: Secondary | ICD-10-CM | POA: Diagnosis not present

## 2019-05-31 DIAGNOSIS — F418 Other specified anxiety disorders: Secondary | ICD-10-CM | POA: Diagnosis not present

## 2019-05-31 DIAGNOSIS — Z8616 Personal history of COVID-19: Secondary | ICD-10-CM | POA: Diagnosis not present

## 2019-05-31 DIAGNOSIS — F0280 Dementia in other diseases classified elsewhere without behavioral disturbance: Secondary | ICD-10-CM | POA: Diagnosis not present

## 2019-05-31 DIAGNOSIS — U071 COVID-19: Secondary | ICD-10-CM | POA: Diagnosis not present

## 2019-05-31 DIAGNOSIS — R1312 Dysphagia, oropharyngeal phase: Secondary | ICD-10-CM | POA: Diagnosis not present

## 2019-05-31 DIAGNOSIS — D72821 Monocytosis (symptomatic): Secondary | ICD-10-CM | POA: Diagnosis not present

## 2019-05-31 DIAGNOSIS — F329 Major depressive disorder, single episode, unspecified: Secondary | ICD-10-CM | POA: Diagnosis not present

## 2019-05-31 DIAGNOSIS — R7989 Other specified abnormal findings of blood chemistry: Secondary | ICD-10-CM | POA: Diagnosis not present

## 2019-05-31 DIAGNOSIS — D72810 Lymphocytopenia: Secondary | ICD-10-CM | POA: Diagnosis not present

## 2019-05-31 DIAGNOSIS — Z809 Family history of malignant neoplasm, unspecified: Secondary | ICD-10-CM | POA: Diagnosis not present

## 2019-05-31 DIAGNOSIS — M6281 Muscle weakness (generalized): Secondary | ICD-10-CM | POA: Diagnosis not present

## 2019-05-31 DIAGNOSIS — Z79899 Other long term (current) drug therapy: Secondary | ICD-10-CM | POA: Diagnosis not present

## 2019-05-31 DIAGNOSIS — R471 Dysarthria and anarthria: Secondary | ICD-10-CM | POA: Diagnosis not present

## 2019-05-31 DIAGNOSIS — R498 Other voice and resonance disorders: Secondary | ICD-10-CM | POA: Diagnosis not present

## 2019-06-15 ENCOUNTER — Inpatient Hospital Stay: Payer: Medicare HMO | Attending: Oncology | Admitting: Oncology

## 2019-06-15 ENCOUNTER — Other Ambulatory Visit: Payer: Self-pay

## 2019-06-15 VITALS — BP 98/62 | HR 66 | Temp 98.2°F | Resp 18 | Wt 127.2 lb

## 2019-06-15 DIAGNOSIS — D709 Neutropenia, unspecified: Secondary | ICD-10-CM

## 2019-06-15 DIAGNOSIS — D696 Thrombocytopenia, unspecified: Secondary | ICD-10-CM | POA: Insufficient documentation

## 2019-06-15 DIAGNOSIS — F329 Major depressive disorder, single episode, unspecified: Secondary | ICD-10-CM | POA: Diagnosis not present

## 2019-06-15 DIAGNOSIS — Z79899 Other long term (current) drug therapy: Secondary | ICD-10-CM | POA: Diagnosis not present

## 2019-06-15 DIAGNOSIS — R498 Other voice and resonance disorders: Secondary | ICD-10-CM | POA: Diagnosis not present

## 2019-06-15 DIAGNOSIS — Z809 Family history of malignant neoplasm, unspecified: Secondary | ICD-10-CM | POA: Insufficient documentation

## 2019-06-15 DIAGNOSIS — G20 Parkinson's disease: Secondary | ICD-10-CM | POA: Insufficient documentation

## 2019-06-15 DIAGNOSIS — Z8616 Personal history of COVID-19: Secondary | ICD-10-CM | POA: Insufficient documentation

## 2019-06-15 DIAGNOSIS — U071 COVID-19: Secondary | ICD-10-CM | POA: Diagnosis not present

## 2019-06-15 DIAGNOSIS — D72821 Monocytosis (symptomatic): Secondary | ICD-10-CM | POA: Diagnosis not present

## 2019-06-15 DIAGNOSIS — R7989 Other specified abnormal findings of blood chemistry: Secondary | ICD-10-CM | POA: Diagnosis not present

## 2019-06-15 DIAGNOSIS — Z8249 Family history of ischemic heart disease and other diseases of the circulatory system: Secondary | ICD-10-CM | POA: Insufficient documentation

## 2019-06-15 NOTE — Progress Notes (Signed)
Reason for the request:    Leukocytopenia and thrombocytopenia.  HPI: I was asked by Dr. Salomon Fick to evaluate William Cummings for the evaluation of leukocytopenia.  He is a 79 year old man with history of Parkinson's disease currently resides at a skilled nursing facility.  He was seen in the emergency department on multiple occasions back in February after presenting with weakness and testing positive for COVID-19.  He did not require any hospitalization at that time.  On May 24, 2019 CBC showed a white cell count 2.9 and a platelet count of 137.  He had a normal hemoglobin.  Repeat laboratory testing on February 1 showed a white cell count of 3.9 and a platelet count of 189.  His hemoglobin was normal at that time.  Previous says CBC dating back to 2019 for all within normal range.  His differential for white cell count did show monocytosis but otherwise no other abnormalities.  Clinically, he has no complaints at this time.  Not a reliable historian and unable to give much history at this time.  He denies any abdominal pain or bleeding.  Denies any headaches or discomfort.  His mobility appears to be limited at this time.  Remaining review of system was negative.    Past Medical History:  Diagnosis Date  . Depression   . Parkinson's disease (HCC)   :  No past surgical history on file.:   Current Outpatient Medications:  .  carbidopa-levodopa (SINEMET CR) 50-200 MG tablet, Take 1 tablet by mouth at bedtime., Disp: 90 tablet, Rfl: 1 .  carbidopa-levodopa (SINEMET IR) 25-100 MG tablet, TAKE 2 TABLETS IN THE MORNING, 1 TABLET IN THE AFTERNOON AND 2 TABLETS IN THE EVENING (Patient taking differently: See admin instructions. TAKE 2 TABLETS IN THE MORNING, 1 TABLET IN THE AFTERNOON AND 2 TABLETS IN THE EVENING), Disp: 450 tablet, Rfl: 0 .  sertraline (ZOLOFT) 100 MG tablet, TAKE 2 TABLETS BY MOUTH EVERY DAY (Patient taking differently: Take 200 mg by mouth daily. ), Disp: 180 tablet, Rfl: 1:  No  Known Allergies:  Family History  Problem Relation Age of Onset  . Heart attack Father   . Cancer Sister   . Early death Sister   . Depression Brother   :  Social History   Socioeconomic History  . Marital status: Divorced    Spouse name: Not on file  . Number of children: Not on file  . Years of education: Not on file  . Highest education level: Not on file  Occupational History  . Occupation: Retired    Comment: Atty.  Tobacco Use  . Smoking status: Never Smoker  . Smokeless tobacco: Never Used  Substance and Sexual Activity  . Alcohol use: Yes    Alcohol/week: 0.0 standard drinks    Comment: glass of wine once every couple months  . Drug use: No  . Sexual activity: Never  Other Topics Concern  . Not on file  Social History Narrative  . Not on file   Social Determinants of Health   Financial Resource Strain:   . Difficulty of Paying Living Expenses: Not on file  Food Insecurity:   . Worried About Programme researcher, broadcasting/film/video in the Last Year: Not on file  . Ran Out of Food in the Last Year: Not on file  Transportation Needs:   . Lack of Transportation (Medical): Not on file  . Lack of Transportation (Non-Medical): Not on file  Physical Activity:   . Days of Exercise per Week:  Not on file  . Minutes of Exercise per Session: Not on file  Stress:   . Feeling of Stress : Not on file  Social Connections:   . Frequency of Communication with Friends and Family: Not on file  . Frequency of Social Gatherings with Friends and Family: Not on file  . Attends Religious Services: Not on file  . Active Member of Clubs or Organizations: Not on file  . Attends Banker Meetings: Not on file  . Marital Status: Not on file  Intimate Partner Violence:   . Fear of Current or Ex-Partner: Not on file  . Emotionally Abused: Not on file  . Physically Abused: Not on file  . Sexually Abused: Not on file  :  Pertinent items are noted in HPI.  Exam: Blood pressure 98/62,  pulse 66, temperature 98.2 F (36.8 C), temperature source Tympanic, resp. rate 18, weight 127 lb 3 oz (57.7 kg), SpO2 99 %.   ECOG 3 General appearance: alert and cooperative appeared without distress. Head: atraumatic without any abnormalities. Eyes: conjunctivae/corneas clear. PERRL.  Sclera anicteric. Throat: Cummings, mucosa, and tongue normal; without oral thrush or ulcers. Resp: clear to auscultation bilaterally without rhonchi, wheezes or dullness to percussion. Cardio: regular rate and rhythm, S1, S2 normal, no murmur, click, rub or gallop GI: soft, non-tender; bowel sounds normal; no masses,  no organomegaly Skin: Skin color, texture, turgor normal. No rashes or lesions Lymph nodes: Cervical, supraclavicular, and axillary nodes normal. Neurologic: Grossly normal without any motor, sensory or deep tendon reflexes. Musculoskeletal: No joint deformity or effusion.  CBC    Component Value Date/Time   WBC 3.9 (L) 05/28/2019 1548   RBC 4.78 05/28/2019 1548   HGB 14.7 05/28/2019 1548   HCT 43.6 05/28/2019 1548   PLT 189 05/28/2019 1548   MCV 91.2 05/28/2019 1548   MCH 30.8 05/28/2019 1548   MCHC 33.7 05/28/2019 1548   RDW 12.7 05/28/2019 1548   LYMPHSABS 0.8 05/24/2019 1427   MONOABS 0.6 05/24/2019 1427   EOSABS 0.0 05/24/2019 1427   BASOSABS 0.0 05/24/2019 1427     CT ABDOMEN PELVIS W CONTRAST  Result Date: 05/28/2019 CLINICAL DATA:  Large inguinal hernia, possible incarceration EXAM: CT ABDOMEN AND PELVIS WITH CONTRAST TECHNIQUE: Multidetector CT imaging of the abdomen and pelvis was performed using the standard protocol following bolus administration of intravenous contrast. CONTRAST:  OMNIPAQUE IOHEXOL 350 MG/ML SOLN COMPARISON:  None. FINDINGS: Lower chest: Mild changes of bronchiectasis and scarring are seen in the lower lobes bilaterally. No focal infiltrate or effusion is seen. Hepatobiliary: Scattered cysts are noted within the liver. The gallbladder is within  normal limits. Pancreas: Unremarkable. No pancreatic ductal dilatation or surrounding inflammatory changes. Spleen: Normal in size without focal abnormality. Adrenals/Urinary Tract: Adrenal glands are within normal limits bilaterally. Kidneys are well visualize without renal calculi or urinary tract obstructive changes. The bladder is partially distended. Some wall thickening is noted within the bladder eccentrically on the left. This is of uncertain significance. Direct visualization when the patient is clinically able may be helpful. Stomach/Bowel: The cecum extends into a large right inguinal hernia. A few loops of small bowel are noted within as well. No obstructive changes are seen secondary to the herniation. The stomach is within normal limits. Vascular/Lymphatic: Aortic atherosclerosis. No enlarged abdominal or pelvic lymph nodes. Reproductive: Prostate is unremarkable. Other: No abdominal wall hernia or abnormality. No abdominopelvic ascites. Musculoskeletal: Postsurgical changes are noted in the lumbar spine. IMPRESSION: Large right inguinal  hernia containing the cecum as well as a portion of the ascending colon and terminal ileum. No obstructive changes are noted. Eccentric bladder wall thickening in the left wall of the bladder. This is of uncertain significance. Direct visualization when able may be helpful. Chronic changes as described above Electronically Signed   By: Inez Catalina M.D.   On: 05/28/2019 20:14     Assessment and Plan:   79 year old man with:  1.  Leukocytopenia detected on January 2021 with a white cell count of 2.9 and monocytosis.  Repeat CBC in February 2021 showed a white cell count of 3.9 with otherwise normal CBC.  The differential diagnosis was reviewed at this time and that benign causes represents the most likely etiology at this time.  Benign ethnic neutropenia versus fluctuating and reactive leukocytopenia is the likely etiology.  Early myelodysplastic syndrome  could also be a consideration given his age.  At this time, his white cell count appeared to be adequate and does not require any intervention.  I have recommended surveillance with periodic CBC in the future.  I do not anticipate him needing any further hematological work-up at this time.  2.  Thrombocytopenia: Detected once with very mild presentation and has resolved at this time.  I do not think this represents a hematological disorder.  3.  Follow-up: I am happy to see him in the future as needed.  I do not see a hematological condition at this time.  45  minutes was spent on this encounter.  The time was dedicated to reviewing laboratory data, discussing differential diagnosis and management options for the future.    A copy of this consult has been forwarded to the requesting physician.

## 2019-06-18 ENCOUNTER — Telehealth: Payer: Self-pay | Admitting: Neurology

## 2019-06-18 NOTE — Telephone Encounter (Signed)
Got a form to sign for PT.  Declined to sign as clinical status unknown.  Pt not seen in the office since 2019.  Pt needs OV.  Does appear that pt had Covid 05/28/19.  Someone should have been able to sign for his PT as was seen at the hospital and is at [facility] place per hospital records

## 2019-06-19 NOTE — Telephone Encounter (Signed)
Refaxed form back to [facility] and advised them to send request to pcp.

## 2019-06-20 DIAGNOSIS — U071 COVID-19: Secondary | ICD-10-CM | POA: Diagnosis not present

## 2019-06-20 DIAGNOSIS — R7989 Other specified abnormal findings of blood chemistry: Secondary | ICD-10-CM | POA: Diagnosis not present

## 2019-06-20 DIAGNOSIS — G20 Parkinson's disease: Secondary | ICD-10-CM | POA: Diagnosis not present

## 2019-06-20 DIAGNOSIS — F0280 Dementia in other diseases classified elsewhere without behavioral disturbance: Secondary | ICD-10-CM | POA: Diagnosis not present

## 2019-06-21 ENCOUNTER — Telehealth: Payer: Self-pay | Admitting: Family Medicine

## 2019-06-21 NOTE — Telephone Encounter (Signed)
Please Advise

## 2019-06-21 NOTE — Telephone Encounter (Signed)
Hewitt Shorts  [phone]  [facility]  They just need a verbal order to put him the program that he qualifies for.   This patient was at [facility] and got COVID there and was hospitalized.  Now he lives at [facility] and they are having a hard time with this patient because he has Parkasins & dementia.   His finances are in so much debt that he qualifies for this program with the Palliative care division.   Please Advise

## 2019-06-22 DIAGNOSIS — G20 Parkinson's disease: Secondary | ICD-10-CM | POA: Diagnosis not present

## 2019-06-22 DIAGNOSIS — R2681 Unsteadiness on feet: Secondary | ICD-10-CM | POA: Diagnosis not present

## 2019-06-22 DIAGNOSIS — R1312 Dysphagia, oropharyngeal phase: Secondary | ICD-10-CM | POA: Diagnosis not present

## 2019-06-22 DIAGNOSIS — M6281 Muscle weakness (generalized): Secondary | ICD-10-CM | POA: Diagnosis not present

## 2019-06-22 DIAGNOSIS — R278 Other lack of coordination: Secondary | ICD-10-CM | POA: Diagnosis not present

## 2019-06-22 DIAGNOSIS — R296 Repeated falls: Secondary | ICD-10-CM | POA: Diagnosis not present

## 2019-06-22 DIAGNOSIS — R2689 Other abnormalities of gait and mobility: Secondary | ICD-10-CM | POA: Diagnosis not present

## 2019-06-22 DIAGNOSIS — R41841 Cognitive communication deficit: Secondary | ICD-10-CM | POA: Diagnosis not present

## 2019-06-25 ENCOUNTER — Telehealth: Payer: Self-pay | Admitting: Family Medicine

## 2019-06-25 DIAGNOSIS — R2689 Other abnormalities of gait and mobility: Secondary | ICD-10-CM | POA: Diagnosis not present

## 2019-06-25 DIAGNOSIS — G20 Parkinson's disease: Secondary | ICD-10-CM | POA: Diagnosis not present

## 2019-06-25 DIAGNOSIS — R41841 Cognitive communication deficit: Secondary | ICD-10-CM | POA: Diagnosis not present

## 2019-06-25 DIAGNOSIS — R1312 Dysphagia, oropharyngeal phase: Secondary | ICD-10-CM | POA: Diagnosis not present

## 2019-06-25 DIAGNOSIS — R278 Other lack of coordination: Secondary | ICD-10-CM | POA: Diagnosis not present

## 2019-06-25 DIAGNOSIS — R296 Repeated falls: Secondary | ICD-10-CM | POA: Diagnosis not present

## 2019-06-25 DIAGNOSIS — M6281 Muscle weakness (generalized): Secondary | ICD-10-CM | POA: Diagnosis not present

## 2019-06-25 DIAGNOSIS — R2681 Unsteadiness on feet: Secondary | ICD-10-CM | POA: Diagnosis not present

## 2019-06-25 NOTE — Telephone Encounter (Signed)
Ok by me, however please contact pt's sister in regards to if this is something the HCPOA wants.

## 2019-06-25 NOTE — Telephone Encounter (Signed)
Pt sister returned phone call, would like a call back.

## 2019-06-25 NOTE — Telephone Encounter (Signed)
Pt sister [name] would like a call back in reference to pt seeing Dr. Clelia Croft she did not know about appt and would like to know if Dr. Salomon Fick has the results from it.

## 2019-06-25 NOTE — Telephone Encounter (Signed)
Spoke with pt sister who was not aware that pt had gone to his appointment with Dr Clelia Croft, advised that per pt chart pt was seen on 06/15/2019 by Dr Clelia Croft. Read the notes to pt sister and verbalized understanding.

## 2019-06-25 NOTE — Telephone Encounter (Signed)
Spoke with pt sister advised that [facility] is requesting for verbal orders for a program that pt qualifies for due to financial debt, pt sister state that she is not able to approve any decisions regarding pt financial issues since she has no control/Power of Attorney over pt finances, state that pt son lives out of the country in [place]. Pt sister state that she will contact pt son over the finance issues and then notify the [facility]. Pt sister was provided with the Hospice number for call back. Dr Salomon Fick notified

## 2019-06-25 NOTE — Telephone Encounter (Signed)
Left a voicemail for pt sister to call the office back regarding pt seeing Dr Clelia Croft

## 2019-06-26 DIAGNOSIS — R2689 Other abnormalities of gait and mobility: Secondary | ICD-10-CM | POA: Diagnosis not present

## 2019-06-26 DIAGNOSIS — R2681 Unsteadiness on feet: Secondary | ICD-10-CM | POA: Diagnosis not present

## 2019-06-26 DIAGNOSIS — G20 Parkinson's disease: Secondary | ICD-10-CM | POA: Diagnosis not present

## 2019-06-26 DIAGNOSIS — M6281 Muscle weakness (generalized): Secondary | ICD-10-CM | POA: Diagnosis not present

## 2019-06-26 DIAGNOSIS — R296 Repeated falls: Secondary | ICD-10-CM | POA: Diagnosis not present

## 2019-06-26 DIAGNOSIS — R1312 Dysphagia, oropharyngeal phase: Secondary | ICD-10-CM | POA: Diagnosis not present

## 2019-06-26 DIAGNOSIS — R41841 Cognitive communication deficit: Secondary | ICD-10-CM | POA: Diagnosis not present

## 2019-06-26 DIAGNOSIS — R278 Other lack of coordination: Secondary | ICD-10-CM | POA: Diagnosis not present

## 2019-06-27 DIAGNOSIS — R2689 Other abnormalities of gait and mobility: Secondary | ICD-10-CM | POA: Diagnosis not present

## 2019-06-27 DIAGNOSIS — G20 Parkinson's disease: Secondary | ICD-10-CM | POA: Diagnosis not present

## 2019-06-27 DIAGNOSIS — R278 Other lack of coordination: Secondary | ICD-10-CM | POA: Diagnosis not present

## 2019-06-27 DIAGNOSIS — R296 Repeated falls: Secondary | ICD-10-CM | POA: Diagnosis not present

## 2019-06-27 DIAGNOSIS — R2681 Unsteadiness on feet: Secondary | ICD-10-CM | POA: Diagnosis not present

## 2019-06-27 DIAGNOSIS — R1312 Dysphagia, oropharyngeal phase: Secondary | ICD-10-CM | POA: Diagnosis not present

## 2019-06-27 DIAGNOSIS — M6281 Muscle weakness (generalized): Secondary | ICD-10-CM | POA: Diagnosis not present

## 2019-06-27 DIAGNOSIS — R41841 Cognitive communication deficit: Secondary | ICD-10-CM | POA: Diagnosis not present

## 2019-06-28 ENCOUNTER — Telehealth: Payer: Self-pay | Admitting: Family Medicine

## 2019-06-28 ENCOUNTER — Ambulatory Visit: Payer: Medicare HMO | Admitting: Neurology

## 2019-06-28 DIAGNOSIS — R296 Repeated falls: Secondary | ICD-10-CM | POA: Diagnosis not present

## 2019-06-28 DIAGNOSIS — R2689 Other abnormalities of gait and mobility: Secondary | ICD-10-CM | POA: Diagnosis not present

## 2019-06-28 DIAGNOSIS — G20 Parkinson's disease: Secondary | ICD-10-CM | POA: Diagnosis not present

## 2019-06-28 DIAGNOSIS — M6281 Muscle weakness (generalized): Secondary | ICD-10-CM | POA: Diagnosis not present

## 2019-06-28 DIAGNOSIS — R2681 Unsteadiness on feet: Secondary | ICD-10-CM | POA: Diagnosis not present

## 2019-06-28 DIAGNOSIS — R1312 Dysphagia, oropharyngeal phase: Secondary | ICD-10-CM | POA: Diagnosis not present

## 2019-06-28 DIAGNOSIS — R278 Other lack of coordination: Secondary | ICD-10-CM | POA: Diagnosis not present

## 2019-06-28 DIAGNOSIS — R41841 Cognitive communication deficit: Secondary | ICD-10-CM | POA: Diagnosis not present

## 2019-06-28 NOTE — Telephone Encounter (Signed)
[facility] program and they have a program at [facility].  The family would like to have a order now so that the pt can benefit from the program.  Randa Evens w/[facility] would like to have a call back as soon as possible to discuss.

## 2019-06-29 DIAGNOSIS — R1312 Dysphagia, oropharyngeal phase: Secondary | ICD-10-CM | POA: Diagnosis not present

## 2019-06-29 DIAGNOSIS — R296 Repeated falls: Secondary | ICD-10-CM | POA: Diagnosis not present

## 2019-06-29 DIAGNOSIS — R2689 Other abnormalities of gait and mobility: Secondary | ICD-10-CM | POA: Diagnosis not present

## 2019-06-29 DIAGNOSIS — R41841 Cognitive communication deficit: Secondary | ICD-10-CM | POA: Diagnosis not present

## 2019-06-29 DIAGNOSIS — R2681 Unsteadiness on feet: Secondary | ICD-10-CM | POA: Diagnosis not present

## 2019-06-29 DIAGNOSIS — R278 Other lack of coordination: Secondary | ICD-10-CM | POA: Diagnosis not present

## 2019-06-29 DIAGNOSIS — M6281 Muscle weakness (generalized): Secondary | ICD-10-CM | POA: Diagnosis not present

## 2019-06-29 DIAGNOSIS — G20 Parkinson's disease: Secondary | ICD-10-CM | POA: Diagnosis not present

## 2019-06-29 NOTE — Telephone Encounter (Addendum)
Hewitt Shorts from [facility] would like a call from [place] at [phone]

## 2019-06-29 NOTE — Telephone Encounter (Signed)
Left a message for Randa Evens RN with [facility] regarding requested orders, advised to contact pt sister who is not the POA for pt finances, advised that pt son is and the only person who can authorize this request, mentioned that  pt son is [place].

## 2019-06-29 NOTE — Telephone Encounter (Signed)
Who is the "family"?   Pt's HCPOA/POA is his son who is in [place] with whom this provider has never actually spoken.  Pt typically accompanied by his sister to appointments. What is this program?

## 2019-06-29 NOTE — Telephone Encounter (Signed)
Attempted to call Randa Evens back office was closed

## 2019-06-29 NOTE — Telephone Encounter (Signed)
Please advise 

## 2019-07-02 ENCOUNTER — Other Ambulatory Visit: Payer: Self-pay

## 2019-07-02 ENCOUNTER — Telehealth: Payer: Self-pay

## 2019-07-02 DIAGNOSIS — G20 Parkinson's disease: Secondary | ICD-10-CM | POA: Diagnosis not present

## 2019-07-02 DIAGNOSIS — R41841 Cognitive communication deficit: Secondary | ICD-10-CM | POA: Diagnosis not present

## 2019-07-02 DIAGNOSIS — R296 Repeated falls: Secondary | ICD-10-CM | POA: Diagnosis not present

## 2019-07-02 DIAGNOSIS — R1312 Dysphagia, oropharyngeal phase: Secondary | ICD-10-CM | POA: Diagnosis not present

## 2019-07-02 DIAGNOSIS — R2681 Unsteadiness on feet: Secondary | ICD-10-CM | POA: Diagnosis not present

## 2019-07-02 DIAGNOSIS — M6281 Muscle weakness (generalized): Secondary | ICD-10-CM | POA: Diagnosis not present

## 2019-07-02 DIAGNOSIS — R2689 Other abnormalities of gait and mobility: Secondary | ICD-10-CM | POA: Diagnosis not present

## 2019-07-02 DIAGNOSIS — R278 Other lack of coordination: Secondary | ICD-10-CM | POA: Diagnosis not present

## 2019-07-02 NOTE — Telephone Encounter (Signed)
Ok for UA and UCx.

## 2019-07-02 NOTE — Telephone Encounter (Signed)
Just spoke with pt sister [name] state that the facility state that pt is very disoriented/confused and they think he might have uti

## 2019-07-02 NOTE — Telephone Encounter (Signed)
Spoke with Hewitt Shorts with [facility] state that this is a new program and its managed by [facility] , she state they offer the program to pt who don't qualify for Hospice care through [facility], Randa Evens state that the program is free and that they send a nurse to the facility to help pt with daily care. [facility] has reached out to pt sister [name] who is on board with this. Spoke with Pt Sister [name] who state that if Dr Salomon Fick advise or thinks pt would benefit from this program she is all for it. Please advise if ok to give verbal orders

## 2019-07-02 NOTE — Telephone Encounter (Signed)
Ok.  Did they say what symptoms pt is having?

## 2019-07-02 NOTE — Telephone Encounter (Signed)
I'm ok with pt being in the program.

## 2019-07-02 NOTE — Telephone Encounter (Signed)
Message sent to Dr Salomon Fick regarding pt sister having concerns that pt may have UTI

## 2019-07-03 ENCOUNTER — Telehealth (INDEPENDENT_AMBULATORY_CARE_PROVIDER_SITE_OTHER): Payer: Medicare HMO | Admitting: Family Medicine

## 2019-07-03 DIAGNOSIS — R410 Disorientation, unspecified: Secondary | ICD-10-CM | POA: Diagnosis not present

## 2019-07-03 DIAGNOSIS — R296 Repeated falls: Secondary | ICD-10-CM | POA: Diagnosis not present

## 2019-07-03 DIAGNOSIS — R278 Other lack of coordination: Secondary | ICD-10-CM | POA: Diagnosis not present

## 2019-07-03 DIAGNOSIS — R41841 Cognitive communication deficit: Secondary | ICD-10-CM | POA: Diagnosis not present

## 2019-07-03 DIAGNOSIS — R2681 Unsteadiness on feet: Secondary | ICD-10-CM | POA: Diagnosis not present

## 2019-07-03 DIAGNOSIS — R1312 Dysphagia, oropharyngeal phase: Secondary | ICD-10-CM | POA: Diagnosis not present

## 2019-07-03 DIAGNOSIS — R2689 Other abnormalities of gait and mobility: Secondary | ICD-10-CM | POA: Diagnosis not present

## 2019-07-03 DIAGNOSIS — G20 Parkinson's disease: Secondary | ICD-10-CM | POA: Diagnosis not present

## 2019-07-03 DIAGNOSIS — M6281 Muscle weakness (generalized): Secondary | ICD-10-CM | POA: Diagnosis not present

## 2019-07-03 LAB — POC URINALSYSI DIPSTICK (AUTOMATED)
Bilirubin, UA: NEGATIVE
Blood, UA: NEGATIVE
Glucose, UA: NEGATIVE
Ketones, UA: NEGATIVE
Leukocytes, UA: NEGATIVE
Nitrite, UA: NEGATIVE
Protein, UA: NEGATIVE
Spec Grav, UA: 1.015 (ref 1.010–1.025)
Urobilinogen, UA: 0.2 E.U./dL
pH, UA: 7 (ref 5.0–8.0)

## 2019-07-03 NOTE — Telephone Encounter (Signed)
Spoke with pt sister [name] who is pr POA/Health verbalized understanding that pt UA is negative for UTI, Advised that the urine will be sent for culture and the office will call her with the results.

## 2019-07-03 NOTE — Telephone Encounter (Signed)
Spoke with Hewitt Shorts with [facility] gave verbal orders for pt to join the program per Dr Salomon Fick approval

## 2019-07-04 ENCOUNTER — Telehealth: Payer: Self-pay

## 2019-07-04 DIAGNOSIS — R2681 Unsteadiness on feet: Secondary | ICD-10-CM | POA: Diagnosis not present

## 2019-07-04 DIAGNOSIS — R296 Repeated falls: Secondary | ICD-10-CM | POA: Diagnosis not present

## 2019-07-04 DIAGNOSIS — M6281 Muscle weakness (generalized): Secondary | ICD-10-CM | POA: Diagnosis not present

## 2019-07-04 DIAGNOSIS — G20 Parkinson's disease: Secondary | ICD-10-CM | POA: Diagnosis not present

## 2019-07-04 DIAGNOSIS — R2689 Other abnormalities of gait and mobility: Secondary | ICD-10-CM | POA: Diagnosis not present

## 2019-07-04 DIAGNOSIS — R1312 Dysphagia, oropharyngeal phase: Secondary | ICD-10-CM | POA: Diagnosis not present

## 2019-07-04 DIAGNOSIS — R278 Other lack of coordination: Secondary | ICD-10-CM | POA: Diagnosis not present

## 2019-07-04 DIAGNOSIS — R41841 Cognitive communication deficit: Secondary | ICD-10-CM | POA: Diagnosis not present

## 2019-07-04 NOTE — Telephone Encounter (Signed)
Pt sister [name] called and stated that pt had a fall last night. Per [name], Pt in home nurse cleaned pt up but notice pt cut on ear in the am looked deep. [name] wants to know if she bring pt into the office to have his ear stitched up if needed.

## 2019-07-05 ENCOUNTER — Other Ambulatory Visit: Payer: Self-pay | Admitting: Family Medicine

## 2019-07-05 DIAGNOSIS — R41841 Cognitive communication deficit: Secondary | ICD-10-CM | POA: Diagnosis not present

## 2019-07-05 DIAGNOSIS — R1312 Dysphagia, oropharyngeal phase: Secondary | ICD-10-CM | POA: Diagnosis not present

## 2019-07-05 DIAGNOSIS — N3000 Acute cystitis without hematuria: Secondary | ICD-10-CM

## 2019-07-05 DIAGNOSIS — R2681 Unsteadiness on feet: Secondary | ICD-10-CM | POA: Diagnosis not present

## 2019-07-05 DIAGNOSIS — R2689 Other abnormalities of gait and mobility: Secondary | ICD-10-CM | POA: Diagnosis not present

## 2019-07-05 DIAGNOSIS — G20 Parkinson's disease: Secondary | ICD-10-CM | POA: Diagnosis not present

## 2019-07-05 DIAGNOSIS — R278 Other lack of coordination: Secondary | ICD-10-CM | POA: Diagnosis not present

## 2019-07-05 DIAGNOSIS — R296 Repeated falls: Secondary | ICD-10-CM | POA: Diagnosis not present

## 2019-07-05 DIAGNOSIS — M6281 Muscle weakness (generalized): Secondary | ICD-10-CM | POA: Diagnosis not present

## 2019-07-05 LAB — URINE CULTURE
MICRO NUMBER:: 10231068
SPECIMEN QUALITY:: ADEQUATE

## 2019-07-05 MED ORDER — AMOXICILLIN 500 MG PO TABS: 500.0000 mg | ORAL_TABLET | Freq: Three times a day (TID) | ORAL | 0 refills | Status: AC

## 2019-07-05 NOTE — Telephone Encounter (Signed)
Pt's ear cut >24 hrs ago.  Unable to do stitches.  Keep area clean and dry.  Monitor for infection.

## 2019-07-06 ENCOUNTER — Emergency Department (HOSPITAL_COMMUNITY)
Admission: EM | Admit: 2019-07-06 | Discharge: 2019-07-06 | Disposition: A | Discharge status: Home / Self Care | Payer: Medicare HMO | Attending: Emergency Medicine | Admitting: Emergency Medicine

## 2019-07-06 ENCOUNTER — Other Ambulatory Visit: Payer: Self-pay

## 2019-07-06 ENCOUNTER — Encounter (HOSPITAL_COMMUNITY): Payer: Self-pay

## 2019-07-06 ENCOUNTER — Emergency Department (HOSPITAL_COMMUNITY): Payer: Medicare HMO

## 2019-07-06 DIAGNOSIS — I10 Essential (primary) hypertension: Secondary | ICD-10-CM | POA: Diagnosis not present

## 2019-07-06 DIAGNOSIS — B952 Enterococcus as the cause of diseases classified elsewhere: Secondary | ICD-10-CM | POA: Insufficient documentation

## 2019-07-06 DIAGNOSIS — S4992XA Unspecified injury of left shoulder and upper arm, initial encounter: Secondary | ICD-10-CM | POA: Diagnosis not present

## 2019-07-06 DIAGNOSIS — R296 Repeated falls: Secondary | ICD-10-CM | POA: Diagnosis not present

## 2019-07-06 DIAGNOSIS — Y92122 Bedroom in nursing home as the place of occurrence of the external cause: Secondary | ICD-10-CM | POA: Insufficient documentation

## 2019-07-06 DIAGNOSIS — S199XXA Unspecified injury of neck, initial encounter: Secondary | ICD-10-CM | POA: Diagnosis not present

## 2019-07-06 DIAGNOSIS — S0990XA Unspecified injury of head, initial encounter: Secondary | ICD-10-CM | POA: Insufficient documentation

## 2019-07-06 DIAGNOSIS — Y999 Unspecified external cause status: Secondary | ICD-10-CM | POA: Diagnosis not present

## 2019-07-06 DIAGNOSIS — Z7401 Bed confinement status: Secondary | ICD-10-CM | POA: Diagnosis not present

## 2019-07-06 DIAGNOSIS — S0003XA Contusion of scalp, initial encounter: Secondary | ICD-10-CM | POA: Diagnosis not present

## 2019-07-06 DIAGNOSIS — S8991XA Unspecified injury of right lower leg, initial encounter: Secondary | ICD-10-CM | POA: Diagnosis not present

## 2019-07-06 DIAGNOSIS — W19XXXA Unspecified fall, initial encounter: Secondary | ICD-10-CM | POA: Insufficient documentation

## 2019-07-06 DIAGNOSIS — S3993XA Unspecified injury of pelvis, initial encounter: Secondary | ICD-10-CM | POA: Diagnosis not present

## 2019-07-06 DIAGNOSIS — N390 Urinary tract infection, site not specified: Secondary | ICD-10-CM | POA: Insufficient documentation

## 2019-07-06 DIAGNOSIS — G20 Parkinson's disease: Secondary | ICD-10-CM | POA: Diagnosis not present

## 2019-07-06 DIAGNOSIS — M6281 Muscle weakness (generalized): Secondary | ICD-10-CM | POA: Diagnosis not present

## 2019-07-06 DIAGNOSIS — R404 Transient alteration of awareness: Secondary | ICD-10-CM | POA: Diagnosis not present

## 2019-07-06 DIAGNOSIS — S299XXA Unspecified injury of thorax, initial encounter: Secondary | ICD-10-CM | POA: Diagnosis not present

## 2019-07-06 DIAGNOSIS — R58 Hemorrhage, not elsewhere classified: Secondary | ICD-10-CM | POA: Diagnosis not present

## 2019-07-06 DIAGNOSIS — M25512 Pain in left shoulder: Secondary | ICD-10-CM | POA: Diagnosis not present

## 2019-07-06 DIAGNOSIS — M79661 Pain in right lower leg: Secondary | ICD-10-CM | POA: Diagnosis not present

## 2019-07-06 DIAGNOSIS — Y9389 Activity, other specified: Secondary | ICD-10-CM | POA: Diagnosis not present

## 2019-07-06 DIAGNOSIS — Z79899 Other long term (current) drug therapy: Secondary | ICD-10-CM | POA: Diagnosis not present

## 2019-07-06 DIAGNOSIS — F0280 Dementia in other diseases classified elsewhere without behavioral disturbance: Secondary | ICD-10-CM | POA: Diagnosis not present

## 2019-07-06 DIAGNOSIS — R2689 Other abnormalities of gait and mobility: Secondary | ICD-10-CM | POA: Diagnosis not present

## 2019-07-06 DIAGNOSIS — R102 Pelvic and perineal pain: Secondary | ICD-10-CM | POA: Diagnosis not present

## 2019-07-06 DIAGNOSIS — R52 Pain, unspecified: Secondary | ICD-10-CM | POA: Diagnosis not present

## 2019-07-06 DIAGNOSIS — M2550 Pain in unspecified joint: Secondary | ICD-10-CM | POA: Diagnosis not present

## 2019-07-06 DIAGNOSIS — R2681 Unsteadiness on feet: Secondary | ICD-10-CM | POA: Diagnosis not present

## 2019-07-06 HISTORY — DX: Unspecified dementia, unspecified severity, without behavioral disturbance, psychotic disturbance, mood disturbance, and anxiety: F03.90

## 2019-07-06 LAB — CBC WITH DIFFERENTIAL/PLATELET
Abs Immature Granulocytes: 0.03 10*3/uL (ref 0.00–0.07)
Basophils Absolute: 0.1 10*3/uL (ref 0.0–0.1)
Basophils Relative: 1 %
Eosinophils Absolute: 0.2 10*3/uL (ref 0.0–0.5)
Eosinophils Relative: 3 %
HCT: 38.3 % — ABNORMAL LOW (ref 39.0–52.0)
Hemoglobin: 12.4 g/dL — ABNORMAL LOW (ref 13.0–17.0)
Immature Granulocytes: 1 %
Lymphocytes Relative: 21 %
Lymphs Abs: 1.1 10*3/uL (ref 0.7–4.0)
MCH: 31.2 pg (ref 26.0–34.0)
MCHC: 32.4 g/dL (ref 30.0–36.0)
MCV: 96.5 fL (ref 80.0–100.0)
Monocytes Absolute: 0.8 10*3/uL (ref 0.1–1.0)
Monocytes Relative: 14 %
Neutro Abs: 3.3 10*3/uL (ref 1.7–7.7)
Neutrophils Relative %: 60 %
Platelets: 173 10*3/uL (ref 150–400)
RBC: 3.97 MIL/uL — ABNORMAL LOW (ref 4.22–5.81)
RDW: 14.5 % (ref 11.5–15.5)
WBC: 5.4 10*3/uL (ref 4.0–10.5)
nRBC: 0 % (ref 0.0–0.2)

## 2019-07-06 LAB — URINALYSIS, ROUTINE W REFLEX MICROSCOPIC
Bilirubin Urine: NEGATIVE
Glucose, UA: NEGATIVE mg/dL
Hgb urine dipstick: NEGATIVE
Ketones, ur: 5 mg/dL — AB
Leukocytes,Ua: NEGATIVE
Nitrite: NEGATIVE
Protein, ur: NEGATIVE mg/dL
Specific Gravity, Urine: 1.023 (ref 1.005–1.030)
pH: 6 (ref 5.0–8.0)

## 2019-07-06 LAB — COMPREHENSIVE METABOLIC PANEL
ALT: 8 U/L (ref 0–44)
AST: 33 U/L (ref 15–41)
Albumin: 4 g/dL (ref 3.5–5.0)
Alkaline Phosphatase: 81 U/L (ref 38–126)
Anion gap: 11 (ref 5–15)
BUN: 31 mg/dL — ABNORMAL HIGH (ref 8–23)
CO2: 25 mmol/L (ref 22–32)
Calcium: 9.3 mg/dL (ref 8.9–10.3)
Chloride: 104 mmol/L (ref 98–111)
Creatinine, Ser: 0.95 mg/dL (ref 0.61–1.24)
GFR calc Af Amer: >60 mL/min (ref >60)
GFR calc non Af Amer: >60 mL/min (ref >60)
Glucose, Bld: 93 mg/dL (ref 70–99)
Potassium: 3.9 mmol/L (ref 3.5–5.1)
Sodium: 140 mmol/L (ref 135–145)
Total Bilirubin: 1.1 mg/dL (ref 0.3–1.2)
Total Protein: 6.9 g/dL (ref 6.5–8.1)

## 2019-07-06 LAB — CK: Total CK: 594 U/L — ABNORMAL HIGH (ref 49–397)

## 2019-07-06 LAB — TROPONIN I (HIGH SENSITIVITY)
Troponin I (High Sensitivity): 24 ng/L — ABNORMAL HIGH (ref <18)
Troponin I (High Sensitivity): 22 ng/L — ABNORMAL HIGH (ref <18)

## 2019-07-06 LAB — CBG MONITORING, ED: Glucose-Capillary: 97 mg/dL (ref 70–99)

## 2019-07-06 MED ORDER — SODIUM CHLORIDE 0.9 % IV BOLUS
1000.0000 mL | Freq: Once | INTRAVENOUS | Status: AC
  Administered 2019-07-06: 1000 mL via INTRAVENOUS

## 2019-07-06 NOTE — ED Notes (Signed)
Pt ambulated over 15 feet with assist device.  Pt showed no signs of distress.  O2 at 100% and Hr 74.

## 2019-07-06 NOTE — ED Notes (Signed)
William Cummings , sister would like an update on her brother, [phone].

## 2019-07-06 NOTE — ED Notes (Signed)
Pt is on independent side of [facility], they do not require report per receptionist.

## 2019-07-06 NOTE — ED Provider Notes (Signed)
[place] DEPT Provider Note   CSN: [id number] Arrival date & time: 07/06/19  2774     History No chief complaint on file.  William Cummings is a 79 y.o. male.  William Cummings is a 79 year old gentleman with a past medical history significant for dementia and Parkinson's disease who presents from [place] facility after sustaining a fall.  Patient has reportedly been falling daily for the last 2 weeks.  Today, he was found down in his room with a bruise on the back of his head so they sent him to the ED.  Patient is alert, and attempts to respond to questions but is mostly unintelligible.  Patient endorses some left shoulder pain, but denies any headache, chest pain, shortness of breath, abdominal pain, diarrhea or dysuria.  Of note, the patient is prescribed 5-day course of amoxicillin for Enterococcus facialis in urine culture with PCP.  However, there is a note stating that the UA was negative for UTI.  Is unclear the patient has been receiving this medication.        Past Medical History:  Diagnosis Date  . Depression   . Parkinson's disease [facility])    Patient Active Problem List   Diagnosis Date Noted  . Dementia associated with Parkinson's disease ([place]) 10/09/2018  . Parkinson's disease ([place]) 08/03/2017  . Depression, recurrent ([place]) 08/03/2017   No past surgical history on file.    Family History  Problem Relation Age of Onset  . Heart attack Father   . Cancer Sister   . Early death Sister   . Depression Brother    Social History   Tobacco Use  . Smoking status: Never Smoker  . Smokeless tobacco: Never Used  Substance Use Topics  . Alcohol use: Yes    Alcohol/week: 0.0 standard drinks    Comment: glass of wine once every couple months  . Drug use: No   Home Medications Prior to Admission medications   Medication Sig Start Date End Date Taking? Authorizing Provider  amoxicillin (AMOXIL) 500 MG tablet Take 1  tablet (500 mg total) by mouth 3 (three) times daily for 5 days. 07/05/19 07/10/19  [name], MD  carbidopa-levodopa (SINEMET CR) 50-200 MG tablet Take 1 tablet by mouth at bedtime. 05/08/19   Tat, Eustace Quail, DO  carbidopa-levodopa (SINEMET IR) 25-100 MG tablet TAKE 2 TABLETS IN THE MORNING, 1 TABLET IN THE AFTERNOON AND 2 TABLETS IN THE EVENING Patient taking differently: See admin instructions. TAKE 2 TABLETS IN THE MORNING, 1 TABLET IN THE AFTERNOON AND 2 TABLETS IN THE EVENING 05/07/19   Tat, Eustace Quail, DO  sertraline (ZOLOFT) 100 MG tablet TAKE 2 TABLETS BY MOUTH EVERY DAY Patient taking differently: Take 200 mg by mouth daily.  11/02/18   [name], MD    Allergies    Patient has no known allergies.  Review of Systems   Review of Systems  Constitutional: Negative.   HENT: Negative.   Eyes: Negative.   Respiratory: Negative.   Cardiovascular: Negative.   Gastrointestinal: Negative.   Endocrine: Negative.   Genitourinary: Negative.   Musculoskeletal: Negative.   Skin: Positive for rash (Right shin).  Allergic/Immunologic: Negative.   Neurological: Positive for speech difficulty.  Hematological: Negative.   Psychiatric/Behavioral: Negative.   All other systems reviewed and are negative.  Physical Exam Updated Vital Signs BP 130/89 (BP Location: Right Arm)   Pulse 68   Resp 16   Ht 5\' 8"  (1.727 m)  Wt 57.7 kg   SpO2 100%   BMI 19.34 kg/m   Physical Exam Vitals reviewed.  Constitutional:      General: He is not in acute distress.    Appearance: He is not ill-appearing, toxic-appearing or diaphoretic.  HENT:     Head: Normocephalic and atraumatic.     Ears:     Comments: R ear laceration - previously documented Neck:     Comments: C-collar in place  Cardiovascular:     Rate and Rhythm: Normal rate and regular rhythm.     Pulses: Normal pulses.     Heart sounds: Normal heart sounds. No murmur. No friction rub. No gallop.   Pulmonary:     Effort:  Pulmonary effort is normal. No respiratory distress.     Breath sounds: Normal breath sounds. No wheezing or rales.  Abdominal:     General: Abdomen is flat. Bowel sounds are normal. There is no distension.     Palpations: Abdomen is soft.     Tenderness: There is no abdominal tenderness. There is no guarding.  Musculoskeletal:        General: Normal range of motion.     Right lower leg: No edema.     Left lower leg: No edema.  Skin:    Findings: Erythema (R shin with broken skin, warm to the touch) present.  Neurological:     General: No focal deficit present.     Mental Status: He is alert. Mental status is at baseline.     Cranial Nerves: No cranial nerve deficit.     Sensory: Sensation is intact.  Psychiatric:        Mood and Affect: Mood normal.    ED Results / Procedures / Treatments   Labs (all labs ordered are listed, but only abnormal results are displayed) Labs Reviewed - No data to display  EKG ED ECG REPORT   Date: 07/06/2019  Rate: 66  Rhythm: normal sinus rhythm  QRS Axis: normal  Intervals: normal  ST/T Wave abnormalities: normal  Conduction Disutrbances:none and Bifasicular block  Narrative Interpretation:   Old EKG Reviewed: unchanged   I have personally reviewed the EKG tracing and agree with the computerized printout as noted.  Radiology No results found.  Procedures Procedures (including critical care time)  Medications Ordered in ED Medications  sodium chloride 0.9 % bolus 1,000 mL (0 mLs Intravenous Stopped 07/06/19 1128)   ED Course  I have reviewed the triage vital signs and the nursing notes.  Pertinent labs & imaging results that were available during my care of the patient were reviewed by me and considered in my medical decision making (see chart for details).    MDM Rules/Calculators/A&P                      [name] recurrent falls and bruising of the head concerning, but has a reassuring neurological exam. No focal  deficits. Will obtain CT head and c-spine. Also ordering X-rays. His erythematous skin on the RLE concerning for potential cellulitis. However, the pt is afebrile and vital signs are stable. No concern for sepsis. Will give 1L IVF.  CT head negative for an acute bleed. CT C-spine did not show any acute process.  Removed c-collar. No C-spine tenderness, and able to move head left and right spontaneously.  ROM limited, likely secondary to underlying Parkinson's.  Patient states he has no pain in his neck at this time.  Pt's vital signs are stable  and appears to be at baseline. Will d/c back to living facility.   Final Clinical Impression(s) / ED Diagnoses Final diagnoses:  Fall, initial encounter    Rx / DC Orders ED Discharge Orders    None       Kirt Boys, MD 07/06/19 1553    Charlynne Pander, MD 07/06/19 [phone]

## 2019-07-06 NOTE — Telephone Encounter (Signed)
Pt daughter notified of update. Advised to call back if area becomes hot, red and or develops puss or any other issues. Pt daughter verbalized understanding.

## 2019-07-06 NOTE — ED Triage Notes (Signed)
Per EMS- patient is from [facility].  Staff reports that the patient has had a fall daily for 2 weeks. Today the patient fell and has an abrasion to the back of the head. Patient c/o headache. No blood thinners.  Patient is oriented to self only. Patien thas a history of dementia.

## 2019-07-06 NOTE — Discharge Instructions (Addendum)
Thank you for allowing [place] to take care of you during your ED visit.  Below is a summary of what we treated:  1.  Falls -We performed a CT of your head and neck, did not show any evidence of bleeding or cervical spine fracture.  2.  Follow-up -Please schedule follow-up with your primary doctor soon as possible -If you start developing one-sided weakness, severe headaches, and new confusion, this may be a sign of a brain bleed.  Please come back to the ED if this is the case.

## 2019-07-06 NOTE — Consult Note (Signed)
[facility]---The home-based palliative care division of [facility].  Pt was enrolled into [facility] services yesterday AM in his apartment at the [facility] [place] with his sister [name] present.  [name] lives overseas in [place].  [facility] rec'd referral amid concerns from [facility] staff that pt was experiencing frequent falls and experiencing functional decline.  In an effort to bolster pt support, family has hired an AM caregiver who comes in to get pt up in AM to bathe, get dressed, eat breakfast and take AM meds.  [facility] has activated additional support to ensure pt is reminded to take his meds in pill planner in the evenings and ensure he has assistance to the dining room for his lunch and dinner meal.  [facility] RN was initiated yesterday to provide ongoing clinical oversight under the direction of Dr Salomon Fick PCP.  Please contact [facility] with questions or assistance with discharge planning.  Julius Bowels, RN   [phone]

## 2019-07-07 LAB — URINE CULTURE: Culture: NO GROWTH

## 2019-07-09 DIAGNOSIS — R1312 Dysphagia, oropharyngeal phase: Secondary | ICD-10-CM | POA: Diagnosis not present

## 2019-07-09 DIAGNOSIS — G20 Parkinson's disease: Secondary | ICD-10-CM | POA: Diagnosis not present

## 2019-07-09 DIAGNOSIS — R2689 Other abnormalities of gait and mobility: Secondary | ICD-10-CM | POA: Diagnosis not present

## 2019-07-09 DIAGNOSIS — R41841 Cognitive communication deficit: Secondary | ICD-10-CM | POA: Diagnosis not present

## 2019-07-09 DIAGNOSIS — R278 Other lack of coordination: Secondary | ICD-10-CM | POA: Diagnosis not present

## 2019-07-09 DIAGNOSIS — R296 Repeated falls: Secondary | ICD-10-CM | POA: Diagnosis not present

## 2019-07-09 DIAGNOSIS — R2681 Unsteadiness on feet: Secondary | ICD-10-CM | POA: Diagnosis not present

## 2019-07-09 DIAGNOSIS — M6281 Muscle weakness (generalized): Secondary | ICD-10-CM | POA: Diagnosis not present

## 2019-07-09 NOTE — Progress Notes (Signed)
Urine dropped off for UA and UCx due to concern of increased confusion at facility.

## 2019-07-10 DIAGNOSIS — R1312 Dysphagia, oropharyngeal phase: Secondary | ICD-10-CM | POA: Diagnosis not present

## 2019-07-10 DIAGNOSIS — R41841 Cognitive communication deficit: Secondary | ICD-10-CM | POA: Diagnosis not present

## 2019-07-10 DIAGNOSIS — R2681 Unsteadiness on feet: Secondary | ICD-10-CM | POA: Diagnosis not present

## 2019-07-10 DIAGNOSIS — R278 Other lack of coordination: Secondary | ICD-10-CM | POA: Diagnosis not present

## 2019-07-10 DIAGNOSIS — M6281 Muscle weakness (generalized): Secondary | ICD-10-CM | POA: Diagnosis not present

## 2019-07-10 DIAGNOSIS — G20 Parkinson's disease: Secondary | ICD-10-CM | POA: Diagnosis not present

## 2019-07-10 DIAGNOSIS — R296 Repeated falls: Secondary | ICD-10-CM | POA: Diagnosis not present

## 2019-07-10 DIAGNOSIS — R2689 Other abnormalities of gait and mobility: Secondary | ICD-10-CM | POA: Diagnosis not present

## 2019-07-11 DIAGNOSIS — R41841 Cognitive communication deficit: Secondary | ICD-10-CM | POA: Diagnosis not present

## 2019-07-11 DIAGNOSIS — G20 Parkinson's disease: Secondary | ICD-10-CM | POA: Diagnosis not present

## 2019-07-11 DIAGNOSIS — R296 Repeated falls: Secondary | ICD-10-CM | POA: Diagnosis not present

## 2019-07-11 DIAGNOSIS — M6281 Muscle weakness (generalized): Secondary | ICD-10-CM | POA: Diagnosis not present

## 2019-07-11 DIAGNOSIS — R2689 Other abnormalities of gait and mobility: Secondary | ICD-10-CM | POA: Diagnosis not present

## 2019-07-11 DIAGNOSIS — R2681 Unsteadiness on feet: Secondary | ICD-10-CM | POA: Diagnosis not present

## 2019-07-11 DIAGNOSIS — R278 Other lack of coordination: Secondary | ICD-10-CM | POA: Diagnosis not present

## 2019-07-11 DIAGNOSIS — R1312 Dysphagia, oropharyngeal phase: Secondary | ICD-10-CM | POA: Diagnosis not present

## 2019-07-12 DIAGNOSIS — R41841 Cognitive communication deficit: Secondary | ICD-10-CM | POA: Diagnosis not present

## 2019-07-12 DIAGNOSIS — R2681 Unsteadiness on feet: Secondary | ICD-10-CM | POA: Diagnosis not present

## 2019-07-12 DIAGNOSIS — M6281 Muscle weakness (generalized): Secondary | ICD-10-CM | POA: Diagnosis not present

## 2019-07-12 DIAGNOSIS — R1312 Dysphagia, oropharyngeal phase: Secondary | ICD-10-CM | POA: Diagnosis not present

## 2019-07-12 DIAGNOSIS — G20 Parkinson's disease: Secondary | ICD-10-CM | POA: Diagnosis not present

## 2019-07-12 DIAGNOSIS — R296 Repeated falls: Secondary | ICD-10-CM | POA: Diagnosis not present

## 2019-07-12 DIAGNOSIS — R278 Other lack of coordination: Secondary | ICD-10-CM | POA: Diagnosis not present

## 2019-07-12 DIAGNOSIS — R2689 Other abnormalities of gait and mobility: Secondary | ICD-10-CM | POA: Diagnosis not present

## 2019-07-13 DIAGNOSIS — M6281 Muscle weakness (generalized): Secondary | ICD-10-CM | POA: Diagnosis not present

## 2019-07-13 DIAGNOSIS — R41841 Cognitive communication deficit: Secondary | ICD-10-CM | POA: Diagnosis not present

## 2019-07-13 DIAGNOSIS — R2681 Unsteadiness on feet: Secondary | ICD-10-CM | POA: Diagnosis not present

## 2019-07-13 DIAGNOSIS — R296 Repeated falls: Secondary | ICD-10-CM | POA: Diagnosis not present

## 2019-07-13 DIAGNOSIS — R2689 Other abnormalities of gait and mobility: Secondary | ICD-10-CM | POA: Diagnosis not present

## 2019-07-13 DIAGNOSIS — R1312 Dysphagia, oropharyngeal phase: Secondary | ICD-10-CM | POA: Diagnosis not present

## 2019-07-13 DIAGNOSIS — R278 Other lack of coordination: Secondary | ICD-10-CM | POA: Diagnosis not present

## 2019-07-13 DIAGNOSIS — G20 Parkinson's disease: Secondary | ICD-10-CM | POA: Diagnosis not present

## 2019-07-16 ENCOUNTER — Telehealth: Payer: Self-pay | Admitting: Family Medicine

## 2019-07-16 DIAGNOSIS — R41841 Cognitive communication deficit: Secondary | ICD-10-CM | POA: Diagnosis not present

## 2019-07-16 DIAGNOSIS — R2681 Unsteadiness on feet: Secondary | ICD-10-CM | POA: Diagnosis not present

## 2019-07-16 DIAGNOSIS — R296 Repeated falls: Secondary | ICD-10-CM | POA: Diagnosis not present

## 2019-07-16 DIAGNOSIS — R1312 Dysphagia, oropharyngeal phase: Secondary | ICD-10-CM | POA: Diagnosis not present

## 2019-07-16 DIAGNOSIS — M6281 Muscle weakness (generalized): Secondary | ICD-10-CM | POA: Diagnosis not present

## 2019-07-16 DIAGNOSIS — G20 Parkinson's disease: Secondary | ICD-10-CM | POA: Diagnosis not present

## 2019-07-16 DIAGNOSIS — R2689 Other abnormalities of gait and mobility: Secondary | ICD-10-CM | POA: Diagnosis not present

## 2019-07-16 DIAGNOSIS — R278 Other lack of coordination: Secondary | ICD-10-CM | POA: Diagnosis not present

## 2019-07-16 NOTE — Telephone Encounter (Signed)
Please advise 

## 2019-07-16 NOTE — Telephone Encounter (Signed)
Spoke with pt sister [name] verbalized understanding of Dr Salomon Fick recommendations

## 2019-07-16 NOTE — Telephone Encounter (Signed)
Pt sister is wanting to know how to go about getting thicken for pt to help him swallow.She would like a follow up call with advice.

## 2019-07-16 NOTE — Telephone Encounter (Signed)
If thick it was recommended by speech language pathologist based on a swallow eval or physical therapy then it could be obtained through his current nursing facility.

## 2019-07-17 DIAGNOSIS — R2689 Other abnormalities of gait and mobility: Secondary | ICD-10-CM | POA: Diagnosis not present

## 2019-07-17 DIAGNOSIS — G20 Parkinson's disease: Secondary | ICD-10-CM | POA: Diagnosis not present

## 2019-07-17 DIAGNOSIS — R296 Repeated falls: Secondary | ICD-10-CM | POA: Diagnosis not present

## 2019-07-17 DIAGNOSIS — M6281 Muscle weakness (generalized): Secondary | ICD-10-CM | POA: Diagnosis not present

## 2019-07-17 DIAGNOSIS — R2681 Unsteadiness on feet: Secondary | ICD-10-CM | POA: Diagnosis not present

## 2019-07-17 DIAGNOSIS — R41841 Cognitive communication deficit: Secondary | ICD-10-CM | POA: Diagnosis not present

## 2019-07-17 DIAGNOSIS — R1312 Dysphagia, oropharyngeal phase: Secondary | ICD-10-CM | POA: Diagnosis not present

## 2019-07-17 DIAGNOSIS — R278 Other lack of coordination: Secondary | ICD-10-CM | POA: Diagnosis not present

## 2019-07-18 ENCOUNTER — Encounter (HOSPITAL_COMMUNITY): Payer: Self-pay

## 2019-07-18 ENCOUNTER — Emergency Department (HOSPITAL_COMMUNITY)
Admission: EM | Admit: 2019-07-18 | Discharge: 2019-07-19 | Disposition: A | Discharge status: Skilled Nursing Facility | Payer: Medicare HMO | Attending: Emergency Medicine | Admitting: Emergency Medicine

## 2019-07-18 ENCOUNTER — Other Ambulatory Visit: Payer: Self-pay

## 2019-07-18 ENCOUNTER — Encounter: Payer: Self-pay | Admitting: Family Medicine

## 2019-07-18 ENCOUNTER — Emergency Department (HOSPITAL_COMMUNITY): Payer: Medicare HMO

## 2019-07-18 ENCOUNTER — Ambulatory Visit (INDEPENDENT_AMBULATORY_CARE_PROVIDER_SITE_OTHER): Payer: Medicare HMO | Admitting: Family Medicine

## 2019-07-18 DIAGNOSIS — R1310 Dysphagia, unspecified: Secondary | ICD-10-CM

## 2019-07-18 DIAGNOSIS — I4510 Unspecified right bundle-branch block: Secondary | ICD-10-CM | POA: Diagnosis not present

## 2019-07-18 DIAGNOSIS — W19XXXA Unspecified fall, initial encounter: Secondary | ICD-10-CM | POA: Diagnosis not present

## 2019-07-18 DIAGNOSIS — S0101XA Laceration without foreign body of scalp, initial encounter: Secondary | ICD-10-CM | POA: Insufficient documentation

## 2019-07-18 DIAGNOSIS — I452 Bifascicular block: Secondary | ICD-10-CM | POA: Diagnosis not present

## 2019-07-18 DIAGNOSIS — R404 Transient alteration of awareness: Secondary | ICD-10-CM | POA: Diagnosis not present

## 2019-07-18 DIAGNOSIS — F0390 Unspecified dementia without behavioral disturbance: Secondary | ICD-10-CM | POA: Insufficient documentation

## 2019-07-18 DIAGNOSIS — Y92129 Unspecified place in nursing home as the place of occurrence of the external cause: Secondary | ICD-10-CM | POA: Diagnosis not present

## 2019-07-18 DIAGNOSIS — I491 Atrial premature depolarization: Secondary | ICD-10-CM | POA: Diagnosis not present

## 2019-07-18 DIAGNOSIS — S79911A Unspecified injury of right hip, initial encounter: Secondary | ICD-10-CM | POA: Diagnosis not present

## 2019-07-18 DIAGNOSIS — G20 Parkinson's disease: Secondary | ICD-10-CM

## 2019-07-18 DIAGNOSIS — R2681 Unsteadiness on feet: Secondary | ICD-10-CM | POA: Diagnosis not present

## 2019-07-18 DIAGNOSIS — I499 Cardiac arrhythmia, unspecified: Secondary | ICD-10-CM | POA: Diagnosis not present

## 2019-07-18 DIAGNOSIS — R682 Dry mouth, unspecified: Secondary | ICD-10-CM | POA: Diagnosis not present

## 2019-07-18 DIAGNOSIS — Z79899 Other long term (current) drug therapy: Secondary | ICD-10-CM | POA: Insufficient documentation

## 2019-07-18 DIAGNOSIS — R296 Repeated falls: Secondary | ICD-10-CM | POA: Diagnosis not present

## 2019-07-18 DIAGNOSIS — R41841 Cognitive communication deficit: Secondary | ICD-10-CM | POA: Diagnosis not present

## 2019-07-18 DIAGNOSIS — S79912A Unspecified injury of left hip, initial encounter: Secondary | ICD-10-CM | POA: Diagnosis not present

## 2019-07-18 DIAGNOSIS — S0990XA Unspecified injury of head, initial encounter: Secondary | ICD-10-CM

## 2019-07-18 DIAGNOSIS — S199XXA Unspecified injury of neck, initial encounter: Secondary | ICD-10-CM | POA: Diagnosis not present

## 2019-07-18 DIAGNOSIS — Y999 Unspecified external cause status: Secondary | ICD-10-CM | POA: Diagnosis not present

## 2019-07-18 DIAGNOSIS — Y9389 Activity, other specified: Secondary | ICD-10-CM | POA: Diagnosis not present

## 2019-07-18 DIAGNOSIS — R1312 Dysphagia, oropharyngeal phase: Secondary | ICD-10-CM | POA: Diagnosis not present

## 2019-07-18 DIAGNOSIS — F0280 Dementia in other diseases classified elsewhere without behavioral disturbance: Secondary | ICD-10-CM | POA: Diagnosis not present

## 2019-07-18 DIAGNOSIS — R278 Other lack of coordination: Secondary | ICD-10-CM | POA: Diagnosis not present

## 2019-07-18 DIAGNOSIS — M6281 Muscle weakness (generalized): Secondary | ICD-10-CM | POA: Diagnosis not present

## 2019-07-18 DIAGNOSIS — R2689 Other abnormalities of gait and mobility: Secondary | ICD-10-CM | POA: Diagnosis not present

## 2019-07-18 MED ORDER — SODIUM CHLORIDE 0.9 % IV SOLN: INTRAVENOUS | Status: DC

## 2019-07-18 MED ORDER — SODIUM CHLORIDE 0.9 % IV BOLUS
500.0000 mL | Freq: Once | INTRAVENOUS | Status: AC
  Administered 2019-07-18: 500 mL via INTRAVENOUS

## 2019-07-18 NOTE — Discharge Instructions (Addendum)
Wound care for the small laceration to the top of the head.  Dress daily with antibiotic ointment and cover.  Work-up otherwise CT head neck x-rays of pelvis and both hips without any significant abnormalities.

## 2019-07-18 NOTE — ED Notes (Signed)
Pt transported to CT ?

## 2019-07-18 NOTE — ED Triage Notes (Signed)
Pt BIB [facility] from [facility]. Pt was found sitting in hallway with blood running down face after an unwitnessed fall. Per EMS, pt has a small laceration to the top of his head. EMS states that they noticed blood on the doorway, and looks as if he fell and hit his head on the door. Pt is A&Ox1 which is baseline. Unsure if pt is on any blood thinners or LOC.

## 2019-07-18 NOTE — ED Provider Notes (Signed)
[facility]-EMERGENCY DEPT Provider Note   CSN: [id number] Arrival date & time: 07/18/19  2200     History Chief Complaint  Patient presents with  . Fall    William Cummings is a 79 y.o. male.  Patient brought in by EMS from [facility] nursing facility following an unwitnessed fall.  Has a small laceration top of his head.  The stated door in his room had some blood on it.  Patient spouse is here with him.  She states that is not able to walk at all without his walker.  And walker was not near where he fell.  Patient has dementia as well as Parkinson disease.  Patient spouse states that he is baseline as far as his alertness goes.  Patient denies any pain.  Tetanus is up-to-date.        Past Medical History:  Diagnosis Date  . Dementia ([facility])   . Depression   . Parkinson's disease [facility])     Patient Active Problem List   Diagnosis Date Noted  . Dementia associated with Parkinson's disease ([facility]) 10/09/2018  . Parkinson's disease ([facility]) 08/03/2017  . Depression, recurrent ([facility]) 08/03/2017    History reviewed. No pertinent surgical history.     Family History  Problem Relation Age of Onset  . Heart attack Father   . Cancer Sister   . Early death Sister   . Depression Brother     Social History   Tobacco Use  . Smoking status: Never Smoker  . Smokeless tobacco: Never Used  Substance Use Topics  . Alcohol use: Not Currently    Alcohol/week: 0.0 standard drinks    Comment: glass of wine once every couple months  . Drug use: No    Home Medications Prior to Admission medications   Medication Sig Start Date End Date Taking? Authorizing Provider  carbidopa-levodopa (SINEMET CR) 50-200 MG tablet Take 1 tablet by mouth at bedtime. 05/08/19  Yes Tat, Octaviano Batty, DO  carbidopa-levodopa (SINEMET IR) 25-100 MG tablet TAKE 2 TABLETS IN THE MORNING, 1 TABLET IN THE AFTERNOON AND 2 TABLETS IN THE EVENING Patient taking differently: See admin  instructions. TAKE 2 TABLETS IN THE MORNING, 1 TABLET IN THE AFTERNOON AND 2 TABLETS IN THE EVENING 05/07/19  Yes Tat, Octaviano Batty, DO  sertraline (ZOLOFT) 100 MG tablet TAKE 2 TABLETS BY MOUTH EVERY DAY Patient taking differently: Take 200 mg by mouth daily.  11/02/18  Yes Deeann Saint, MD    Allergies    Patient has no known allergies.  Review of Systems   Review of Systems  Unable to perform ROS: Dementia    Physical Exam Updated Vital Signs BP 140/70 (BP Location: Right Arm)   Pulse 65   Temp (!) 97.3 F (36.3 C) (Axillary)   Resp 20   SpO2 100%   Physical Exam Vitals and nursing note reviewed.  Constitutional:      Appearance: Normal appearance. He is well-developed.  HENT:     Head: Normocephalic.     Comments: Top of scalp with about a 5 mm puncture type wound.  No active bleeding.    Mouth/Throat:     Mouth: Mucous membranes are dry.  Eyes:     Extraocular Movements: Extraocular movements intact.     Conjunctiva/sclera: Conjunctivae normal.     Pupils: Pupils are equal, round, and reactive to light.  Cardiovascular:     Rate and Rhythm: Normal rate and regular rhythm.     Heart  sounds: No murmur.  Pulmonary:     Effort: Pulmonary effort is normal. No respiratory distress.     Breath sounds: Normal breath sounds.  Abdominal:     Palpations: Abdomen is soft.     Tenderness: There is no abdominal tenderness.  Musculoskeletal:        General: No swelling. Normal range of motion.     Cervical back: Normal range of motion and neck supple. No tenderness.     Comments: No tenderness to palpation of the back area.  No pain with movement of upper extremities or lower extremities.  No obvious deformity.  No obvious injury.  Skin:    General: Skin is warm and dry.     Capillary Refill: Capillary refill takes less than 2 seconds.  Neurological:     Mental Status: He is alert. Mental status is at baseline.     Cranial Nerves: No cranial nerve deficit.     Sensory: No  sensory deficit.     Motor: No weakness.     Comments: Able to move all extremities in the bed.     ED Results / Procedures / Treatments   Labs (all labs ordered are listed, but only abnormal results are displayed) Labs Reviewed  COMPREHENSIVE METABOLIC PANEL  CBC WITH DIFFERENTIAL/PLATELET    EKG EKG Interpretation  Date/Time:  Wednesday July 18 2019 23:08:08 EDT Ventricular Rate:  61 PR Interval:    QRS Duration: 142 QT Interval:  417 QTC Calculation: 420 R Axis:   -67 Text Interpretation: Sinus rhythm Atrial premature complexes Borderline prolonged PR interval RBBB and LAFB Left ventricular hypertrophy No significant change since last tracing Confirmed by Vanetta Mulders [phone]) on 07/18/2019 11:18:58 PM   Radiology CT Head Wo Contrast  Result Date: 07/18/2019 CLINICAL DATA:  79 year old male with head trauma. EXAM: CT HEAD WITHOUT CONTRAST CT CERVICAL SPINE WITHOUT CONTRAST TECHNIQUE: Multidetector CT imaging of the head and cervical spine was performed following the standard protocol without intravenous contrast. Multiplanar CT image reconstructions of the cervical spine were also generated. COMPARISON:  CT dated 07/06/2019. FINDINGS: CT HEAD FINDINGS Brain: There is mild age-related atrophy and chronic microvascular ischemic changes. There is no acute intracranial hemorrhage. No mass effect or midline shift. No extra-axial fluid collection. Vascular: No hyperdense vessel or unexpected calcification. Skull: Normal. Negative for fracture or focal lesion. Sinuses/Orbits: No acute finding. Cerumen noted in the external auditory canals bilaterally. Other: None CT CERVICAL SPINE FINDINGS Alignment: No acute subluxation. There is reversal of normal cervical doses which may be positional or due to muscle spasm. Skull base and vertebrae: No acute fracture. Soft tissues and spinal canal: No prevertebral fluid or swelling. No visible canal hematoma. Disc levels:  Multilevel degenerative  changes and facet arthropathy. Upper chest: Negative. Other: None IMPRESSION: 1. No acute intracranial pathology. 2. Mild age-related atrophy and chronic microvascular ischemic changes. 3. No acute/traumatic cervical spine pathology. Electronically Signed   By: Elgie Collard M.D.   On: 07/18/2019 23:12   CT Cervical Spine Wo Contrast  Result Date: 07/18/2019 CLINICAL DATA:  79 year old male with head trauma. EXAM: CT HEAD WITHOUT CONTRAST CT CERVICAL SPINE WITHOUT CONTRAST TECHNIQUE: Multidetector CT imaging of the head and cervical spine was performed following the standard protocol without intravenous contrast. Multiplanar CT image reconstructions of the cervical spine were also generated. COMPARISON:  CT dated 07/06/2019. FINDINGS: CT HEAD FINDINGS Brain: There is mild age-related atrophy and chronic microvascular ischemic changes. There is no acute intracranial hemorrhage. No mass effect  or midline shift. No extra-axial fluid collection. Vascular: No hyperdense vessel or unexpected calcification. Skull: Normal. Negative for fracture or focal lesion. Sinuses/Orbits: No acute finding. Cerumen noted in the external auditory canals bilaterally. Other: None CT CERVICAL SPINE FINDINGS Alignment: No acute subluxation. There is reversal of normal cervical doses which may be positional or due to muscle spasm. Skull base and vertebrae: No acute fracture. Soft tissues and spinal canal: No prevertebral fluid or swelling. No visible canal hematoma. Disc levels:  Multilevel degenerative changes and facet arthropathy. Upper chest: Negative. Other: None IMPRESSION: 1. No acute intracranial pathology. 2. Mild age-related atrophy and chronic microvascular ischemic changes. 3. No acute/traumatic cervical spine pathology. Electronically Signed   By: Anner Crete M.D.   On: 07/18/2019 23:12   DG Hips Bilat W or Wo Pelvis 3-4 Views  Result Date: 07/18/2019 CLINICAL DATA:  Initial evaluation for acute trauma, fall.  EXAM: DG HIP (WITH OR WITHOUT PELVIS) 3-4V BILAT COMPARISON:  None. FINDINGS: No acute fracture or dislocation. Femoral heads in normal alignment with the acetabula. Femoral head height maintained. Bony pelvis intact. No pubic diastasis. SI joints approximated. Moderate osteoarthritic changes present about the hips bilaterally. Postsurgical changes noted within the lower lumbar spine. No visible soft tissue abnormality. IMPRESSION: No acute osseous abnormality about either hip. Electronically Signed   By: Jeannine Boga M.D.   On: 07/18/2019 23:02    Procedures Procedures (including critical care time)  Medications Ordered in ED Medications  0.9 %  sodium chloride infusion (has no administration in time range)  sodium chloride 0.9 % bolus 500 mL (500 mLs Intravenous New Bag/Given 07/18/19 2357)    ED Course  I have reviewed the triage vital signs and the nursing notes.  Pertinent labs & imaging results that were available during my care of the patient were reviewed by me and considered in my medical decision making (see chart for details).    MDM Rules/Calculators/A&P                      Scalp wound only 5 mm or less.  Can heal on its own.  No active bleeding.  Dressed with antibiotic ointment and a covering.  CT head neck without any acute findings.  X-rays of the pelvis and both hips without any bony abnormalities.  Patient clinically little on the dehydrated side based on somewhat of a dry mouth.  Patient will receive some IV fluids.  And basic labs have been ordered.  EKG without any acute changes.  Still shows a persistent right bundle branch block and a left anterior fascicular block.  Labs without any significant abnormalities.  No leukocytosis no significant anemia.  Electrolytes normal kidney function normal.  Liver function tests normal.  Patient stable for discharge back to facility.  Final Clinical Impression(s) / ED Diagnoses Final diagnoses:  Fall, initial  encounter  Injury of head, initial encounter  Scalp laceration, initial encounter  Parkinson's disease ([place])  Dementia without behavioral disturbance, unspecified dementia type [facility])    Rx / DC Orders ED Discharge Orders    None       Fredia Sorrow, MD 07/19/19 0031

## 2019-07-18 NOTE — Progress Notes (Signed)
Virtual Visit via Telephone Note  I connected with William Cummings [facility] on 07/18/19 at  1:30 PM EDT by telephone and verified that I am speaking with the correct person using two identifiers.   I discussed the limitations, risks, security and privacy concerns of performing an evaluation and management service by telephone and the availability of in person appointments. I also discussed with the patient that there may be a patient responsible charge related to this service. The patient expressed understanding and agreed to proceed.  Location patient: home Location provider: work or home office Participants present for the call: patient, provider, pt's sister William Cummings Patient did not have a visit in the prior 7 days to address this/these issue(s).   History of Present Illness: Pt is an 79 yo male with pmh sig for Dementia a/w Parkinson's, h/o depression.  Pt now resides at [facility] in independent plus living after having COVID-19 while at previous living facility.  Pt currently on a regular diet.  [name] notes unable to request diet changes.  [name] expresses concern for aspiration pneumonia.  [name] who is pt's POA is in [place].  He is planning to come to the states in May or June.  Next Neurology appt the end of April.   Observations/Objective: Patient sounds cheerful and well on the phone. I do not appreciate any SOB. Speech and thought processing are grossly intact. Patient reported vitals:  Assessment and Plan: Dysphagia, unspecified type  -likely 2/2 Parkinson's dz -would likely benefit from a modified diet.  Will obtain swallow study to determine appropriate modifications - Plan: DG SWALLOW STUDY OP MEDICARE SPEECH PATH  Dementia associated with Parkinson's disease (HCC) -continue sinemet 25-100 mg take 2 tabs in the morning, 1 tab in the afternoon and 2 tablets in the evening.  Also taking Sinemet 50-200 mg 1 tab at bedtime. -Continue follow-up with Dr.  Arbutus Leas, neurology   Follow Up Instructions: F/u prn  I did not refer this patient for an OV in the next 24 hours for this/these issue(s).  I discussed the assessment and treatment plan with the patient. The patient was provided an opportunity to ask questions and all were answered. The patient agreed with the plan and demonstrated an understanding of the instructions.   The patient was advised to call back or seek an in-person evaluation if the symptoms worsen or if the condition fails to improve as anticipated.  I provided 7 minutes of non-face-to-face time during this encounter.   Deeann Saint, MD

## 2019-07-19 DIAGNOSIS — R41841 Cognitive communication deficit: Secondary | ICD-10-CM | POA: Diagnosis not present

## 2019-07-19 DIAGNOSIS — G20 Parkinson's disease: Secondary | ICD-10-CM | POA: Diagnosis not present

## 2019-07-19 DIAGNOSIS — R278 Other lack of coordination: Secondary | ICD-10-CM | POA: Diagnosis not present

## 2019-07-19 DIAGNOSIS — R2689 Other abnormalities of gait and mobility: Secondary | ICD-10-CM | POA: Diagnosis not present

## 2019-07-19 DIAGNOSIS — Z7401 Bed confinement status: Secondary | ICD-10-CM | POA: Diagnosis not present

## 2019-07-19 DIAGNOSIS — W19XXXA Unspecified fall, initial encounter: Secondary | ICD-10-CM | POA: Diagnosis not present

## 2019-07-19 DIAGNOSIS — R5381 Other malaise: Secondary | ICD-10-CM | POA: Diagnosis not present

## 2019-07-19 DIAGNOSIS — R296 Repeated falls: Secondary | ICD-10-CM | POA: Diagnosis not present

## 2019-07-19 DIAGNOSIS — R2681 Unsteadiness on feet: Secondary | ICD-10-CM | POA: Diagnosis not present

## 2019-07-19 DIAGNOSIS — M2550 Pain in unspecified joint: Secondary | ICD-10-CM | POA: Diagnosis not present

## 2019-07-19 DIAGNOSIS — M6281 Muscle weakness (generalized): Secondary | ICD-10-CM | POA: Diagnosis not present

## 2019-07-19 DIAGNOSIS — R1312 Dysphagia, oropharyngeal phase: Secondary | ICD-10-CM | POA: Diagnosis not present

## 2019-07-19 LAB — CBC WITH DIFFERENTIAL/PLATELET
Abs Immature Granulocytes: 0.03 10*3/uL (ref 0.00–0.07)
Basophils Absolute: 0.1 10*3/uL (ref 0.0–0.1)
Basophils Relative: 1 %
Eosinophils Absolute: 0.3 10*3/uL (ref 0.0–0.5)
Eosinophils Relative: 6 %
HCT: 34.3 % — ABNORMAL LOW (ref 39.0–52.0)
Hemoglobin: 11.4 g/dL — ABNORMAL LOW (ref 13.0–17.0)
Immature Granulocytes: 1 %
Lymphocytes Relative: 24 %
Lymphs Abs: 1.3 10*3/uL (ref 0.7–4.0)
MCH: 31.8 pg (ref 26.0–34.0)
MCHC: 33.2 g/dL (ref 30.0–36.0)
MCV: 95.5 fL (ref 80.0–100.0)
Monocytes Absolute: 0.6 10*3/uL (ref 0.1–1.0)
Monocytes Relative: 11 %
Neutro Abs: 3.1 10*3/uL (ref 1.7–7.7)
Neutrophils Relative %: 57 %
Platelets: 199 10*3/uL (ref 150–400)
RBC: 3.59 MIL/uL — ABNORMAL LOW (ref 4.22–5.81)
RDW: 14.9 % (ref 11.5–15.5)
WBC: 5.4 10*3/uL (ref 4.0–10.5)
nRBC: 0 % (ref 0.0–0.2)

## 2019-07-19 LAB — COMPREHENSIVE METABOLIC PANEL
ALT: 6 U/L (ref 0–44)
AST: 21 U/L (ref 15–41)
Albumin: 3.7 g/dL (ref 3.5–5.0)
Alkaline Phosphatase: 76 U/L (ref 38–126)
Anion gap: 6 (ref 5–15)
BUN: 23 mg/dL (ref 8–23)
CO2: 26 mmol/L (ref 22–32)
Calcium: 9.2 mg/dL (ref 8.9–10.3)
Chloride: 106 mmol/L (ref 98–111)
Creatinine, Ser: 0.97 mg/dL (ref 0.61–1.24)
GFR calc Af Amer: >60 mL/min (ref >60)
GFR calc non Af Amer: >60 mL/min (ref >60)
Glucose, Bld: 99 mg/dL (ref 70–99)
Potassium: 4.5 mmol/L (ref 3.5–5.1)
Sodium: 138 mmol/L (ref 135–145)
Total Bilirubin: 0.7 mg/dL (ref 0.3–1.2)
Total Protein: 6.4 g/dL — ABNORMAL LOW (ref 6.5–8.1)

## 2019-07-19 MED ORDER — BACITRACIN ZINC 500 UNIT/GM EX OINT
TOPICAL_OINTMENT | CUTANEOUS | Status: AC | PRN
  Administered 2019-07-19: 2 via TOPICAL
  Filled 2019-07-19: qty 1.8

## 2019-07-19 NOTE — ED Notes (Signed)
Laceration cleaned, Bacitracin applied. New dressing placed on laceration.

## 2019-07-19 NOTE — ED Notes (Signed)
Discharge instructions reviewed with Sister. Wound care/instructions reviewed with sister.    [facility] called for transport.

## 2019-07-19 NOTE — ED Notes (Signed)
Pt brief was soiled. Brief changed by staff, new brief placed on pt. Warm blanket provided.

## 2019-07-20 ENCOUNTER — Telehealth: Payer: Self-pay | Admitting: Family Medicine

## 2019-07-20 DIAGNOSIS — R296 Repeated falls: Secondary | ICD-10-CM | POA: Diagnosis not present

## 2019-07-20 DIAGNOSIS — G20 Parkinson's disease: Secondary | ICD-10-CM | POA: Diagnosis not present

## 2019-07-20 DIAGNOSIS — R2689 Other abnormalities of gait and mobility: Secondary | ICD-10-CM | POA: Diagnosis not present

## 2019-07-20 DIAGNOSIS — R2681 Unsteadiness on feet: Secondary | ICD-10-CM | POA: Diagnosis not present

## 2019-07-20 DIAGNOSIS — R1312 Dysphagia, oropharyngeal phase: Secondary | ICD-10-CM | POA: Diagnosis not present

## 2019-07-20 DIAGNOSIS — M6281 Muscle weakness (generalized): Secondary | ICD-10-CM | POA: Diagnosis not present

## 2019-07-20 DIAGNOSIS — R278 Other lack of coordination: Secondary | ICD-10-CM | POA: Diagnosis not present

## 2019-07-20 DIAGNOSIS — R41841 Cognitive communication deficit: Secondary | ICD-10-CM | POA: Diagnosis not present

## 2019-07-20 NOTE — Telephone Encounter (Signed)
Pt forms has been received and been placed on Dr Salomon Fick desk for review, form will be faxed when completed

## 2019-07-20 NOTE — Telephone Encounter (Signed)
Pt sister called to advise that [facility] where the pt lives has emailed (she thinks) a form to have pt moved to memory care.

## 2019-07-23 DIAGNOSIS — R2681 Unsteadiness on feet: Secondary | ICD-10-CM | POA: Diagnosis not present

## 2019-07-23 DIAGNOSIS — G20 Parkinson's disease: Secondary | ICD-10-CM | POA: Diagnosis not present

## 2019-07-23 DIAGNOSIS — R278 Other lack of coordination: Secondary | ICD-10-CM | POA: Diagnosis not present

## 2019-07-23 DIAGNOSIS — M6281 Muscle weakness (generalized): Secondary | ICD-10-CM | POA: Diagnosis not present

## 2019-07-23 NOTE — Telephone Encounter (Signed)
Pt forms will be faxed to [facility] as requested.

## 2019-07-23 NOTE — Telephone Encounter (Signed)
Pt's relative, Virgel Gess, stated that [facility] faxed over orders for [facility] to sign in order to move th pt where he needs to be. She said she would like them faxed back today in order to get him started asap.   Fax is placed in Red folder

## 2019-07-24 DIAGNOSIS — M6281 Muscle weakness (generalized): Secondary | ICD-10-CM | POA: Diagnosis not present

## 2019-07-24 DIAGNOSIS — R2681 Unsteadiness on feet: Secondary | ICD-10-CM | POA: Diagnosis not present

## 2019-07-24 DIAGNOSIS — R278 Other lack of coordination: Secondary | ICD-10-CM | POA: Diagnosis not present

## 2019-07-24 DIAGNOSIS — G20 Parkinson's disease: Secondary | ICD-10-CM | POA: Diagnosis not present

## 2019-07-25 DIAGNOSIS — R1312 Dysphagia, oropharyngeal phase: Secondary | ICD-10-CM | POA: Diagnosis not present

## 2019-07-25 DIAGNOSIS — R41841 Cognitive communication deficit: Secondary | ICD-10-CM | POA: Diagnosis not present

## 2019-07-25 DIAGNOSIS — R278 Other lack of coordination: Secondary | ICD-10-CM | POA: Diagnosis not present

## 2019-07-25 DIAGNOSIS — G20 Parkinson's disease: Secondary | ICD-10-CM | POA: Diagnosis not present

## 2019-07-25 DIAGNOSIS — R2681 Unsteadiness on feet: Secondary | ICD-10-CM | POA: Diagnosis not present

## 2019-07-25 DIAGNOSIS — R296 Repeated falls: Secondary | ICD-10-CM | POA: Diagnosis not present

## 2019-07-25 DIAGNOSIS — R2689 Other abnormalities of gait and mobility: Secondary | ICD-10-CM | POA: Diagnosis not present

## 2019-07-25 DIAGNOSIS — M6281 Muscle weakness (generalized): Secondary | ICD-10-CM | POA: Diagnosis not present

## 2019-07-25 NOTE — Telephone Encounter (Signed)
Charity Azorlibu from [facility] is requesting a amended FL2 stating just memory care because they do not have skilled nursing there. She is requesting this correction as soon as possible because they are trying to have pt moved by Friday.   She advised that if a call can me made to confirm that amendment will be made and it is provided that would be fine.    Charity Cell: [phone]

## 2019-07-25 NOTE — Telephone Encounter (Signed)
I called Charity at the number below and left a detailed message stating the forms were completed by Dr Salomon Fick, faxed earlier and sent to be scanned.  I asked that she call back if she has any questions.

## 2019-07-26 DIAGNOSIS — R1312 Dysphagia, oropharyngeal phase: Secondary | ICD-10-CM | POA: Diagnosis not present

## 2019-07-26 DIAGNOSIS — R41841 Cognitive communication deficit: Secondary | ICD-10-CM | POA: Diagnosis not present

## 2019-07-27 DIAGNOSIS — R2689 Other abnormalities of gait and mobility: Secondary | ICD-10-CM | POA: Diagnosis not present

## 2019-07-27 DIAGNOSIS — M6281 Muscle weakness (generalized): Secondary | ICD-10-CM | POA: Diagnosis not present

## 2019-07-27 DIAGNOSIS — R278 Other lack of coordination: Secondary | ICD-10-CM | POA: Diagnosis not present

## 2019-07-27 DIAGNOSIS — G20 Parkinson's disease: Secondary | ICD-10-CM | POA: Diagnosis not present

## 2019-07-27 DIAGNOSIS — R296 Repeated falls: Secondary | ICD-10-CM | POA: Diagnosis not present

## 2019-07-27 DIAGNOSIS — R2681 Unsteadiness on feet: Secondary | ICD-10-CM | POA: Diagnosis not present

## 2019-07-27 DIAGNOSIS — R41841 Cognitive communication deficit: Secondary | ICD-10-CM | POA: Diagnosis not present

## 2019-07-27 DIAGNOSIS — R1312 Dysphagia, oropharyngeal phase: Secondary | ICD-10-CM | POA: Diagnosis not present

## 2019-07-30 ENCOUNTER — Telehealth: Payer: Self-pay

## 2019-07-30 DIAGNOSIS — R296 Repeated falls: Secondary | ICD-10-CM | POA: Diagnosis not present

## 2019-07-30 DIAGNOSIS — R278 Other lack of coordination: Secondary | ICD-10-CM | POA: Diagnosis not present

## 2019-07-30 DIAGNOSIS — R2689 Other abnormalities of gait and mobility: Secondary | ICD-10-CM | POA: Diagnosis not present

## 2019-07-30 DIAGNOSIS — R2681 Unsteadiness on feet: Secondary | ICD-10-CM | POA: Diagnosis not present

## 2019-07-30 DIAGNOSIS — M6281 Muscle weakness (generalized): Secondary | ICD-10-CM | POA: Diagnosis not present

## 2019-07-30 DIAGNOSIS — G20 Parkinson's disease: Secondary | ICD-10-CM | POA: Diagnosis not present

## 2019-07-30 NOTE — Telephone Encounter (Signed)
Pt paperwork for OT therapist progress and updated plan of care was signed and faxed to [facility]

## 2019-07-31 DIAGNOSIS — R1312 Dysphagia, oropharyngeal phase: Secondary | ICD-10-CM | POA: Diagnosis not present

## 2019-07-31 DIAGNOSIS — R41841 Cognitive communication deficit: Secondary | ICD-10-CM | POA: Diagnosis not present

## 2019-08-01 DIAGNOSIS — Z20828 Contact with and (suspected) exposure to other viral communicable diseases: Secondary | ICD-10-CM | POA: Diagnosis not present

## 2019-08-01 DIAGNOSIS — R296 Repeated falls: Secondary | ICD-10-CM | POA: Diagnosis not present

## 2019-08-01 DIAGNOSIS — R2681 Unsteadiness on feet: Secondary | ICD-10-CM | POA: Diagnosis not present

## 2019-08-01 DIAGNOSIS — M6281 Muscle weakness (generalized): Secondary | ICD-10-CM | POA: Diagnosis not present

## 2019-08-01 DIAGNOSIS — R2689 Other abnormalities of gait and mobility: Secondary | ICD-10-CM | POA: Diagnosis not present

## 2019-08-01 DIAGNOSIS — G20 Parkinson's disease: Secondary | ICD-10-CM | POA: Diagnosis not present

## 2019-08-01 DIAGNOSIS — R278 Other lack of coordination: Secondary | ICD-10-CM | POA: Diagnosis not present

## 2019-08-01 DIAGNOSIS — Z1159 Encounter for screening for other viral diseases: Secondary | ICD-10-CM | POA: Diagnosis not present

## 2019-08-02 ENCOUNTER — Other Ambulatory Visit (HOSPITAL_COMMUNITY): Payer: Self-pay

## 2019-08-02 DIAGNOSIS — R41841 Cognitive communication deficit: Secondary | ICD-10-CM | POA: Diagnosis not present

## 2019-08-02 DIAGNOSIS — R1312 Dysphagia, oropharyngeal phase: Secondary | ICD-10-CM | POA: Diagnosis not present

## 2019-08-02 DIAGNOSIS — R1310 Dysphagia, unspecified: Secondary | ICD-10-CM

## 2019-08-03 DIAGNOSIS — R296 Repeated falls: Secondary | ICD-10-CM | POA: Diagnosis not present

## 2019-08-03 DIAGNOSIS — R278 Other lack of coordination: Secondary | ICD-10-CM | POA: Diagnosis not present

## 2019-08-03 DIAGNOSIS — M6281 Muscle weakness (generalized): Secondary | ICD-10-CM | POA: Diagnosis not present

## 2019-08-03 DIAGNOSIS — R41841 Cognitive communication deficit: Secondary | ICD-10-CM | POA: Diagnosis not present

## 2019-08-03 DIAGNOSIS — R2689 Other abnormalities of gait and mobility: Secondary | ICD-10-CM | POA: Diagnosis not present

## 2019-08-03 DIAGNOSIS — R2681 Unsteadiness on feet: Secondary | ICD-10-CM | POA: Diagnosis not present

## 2019-08-03 DIAGNOSIS — G20 Parkinson's disease: Secondary | ICD-10-CM | POA: Diagnosis not present

## 2019-08-03 DIAGNOSIS — R1312 Dysphagia, oropharyngeal phase: Secondary | ICD-10-CM | POA: Diagnosis not present

## 2019-08-06 DIAGNOSIS — R296 Repeated falls: Secondary | ICD-10-CM | POA: Diagnosis not present

## 2019-08-06 DIAGNOSIS — R1312 Dysphagia, oropharyngeal phase: Secondary | ICD-10-CM | POA: Diagnosis not present

## 2019-08-06 DIAGNOSIS — G20 Parkinson's disease: Secondary | ICD-10-CM | POA: Diagnosis not present

## 2019-08-06 DIAGNOSIS — R41841 Cognitive communication deficit: Secondary | ICD-10-CM | POA: Diagnosis not present

## 2019-08-06 DIAGNOSIS — R2681 Unsteadiness on feet: Secondary | ICD-10-CM | POA: Diagnosis not present

## 2019-08-06 DIAGNOSIS — R2689 Other abnormalities of gait and mobility: Secondary | ICD-10-CM | POA: Diagnosis not present

## 2019-08-06 DIAGNOSIS — M6281 Muscle weakness (generalized): Secondary | ICD-10-CM | POA: Diagnosis not present

## 2019-08-06 DIAGNOSIS — R278 Other lack of coordination: Secondary | ICD-10-CM | POA: Diagnosis not present

## 2019-08-07 DIAGNOSIS — R278 Other lack of coordination: Secondary | ICD-10-CM | POA: Diagnosis not present

## 2019-08-07 DIAGNOSIS — M6281 Muscle weakness (generalized): Secondary | ICD-10-CM | POA: Diagnosis not present

## 2019-08-07 DIAGNOSIS — G20 Parkinson's disease: Secondary | ICD-10-CM | POA: Diagnosis not present

## 2019-08-07 DIAGNOSIS — R2681 Unsteadiness on feet: Secondary | ICD-10-CM | POA: Diagnosis not present

## 2019-08-08 DIAGNOSIS — R296 Repeated falls: Secondary | ICD-10-CM | POA: Diagnosis not present

## 2019-08-08 DIAGNOSIS — Z20828 Contact with and (suspected) exposure to other viral communicable diseases: Secondary | ICD-10-CM | POA: Diagnosis not present

## 2019-08-08 DIAGNOSIS — M6281 Muscle weakness (generalized): Secondary | ICD-10-CM | POA: Diagnosis not present

## 2019-08-08 DIAGNOSIS — Z1159 Encounter for screening for other viral diseases: Secondary | ICD-10-CM | POA: Diagnosis not present

## 2019-08-08 DIAGNOSIS — R2681 Unsteadiness on feet: Secondary | ICD-10-CM | POA: Diagnosis not present

## 2019-08-08 DIAGNOSIS — R2689 Other abnormalities of gait and mobility: Secondary | ICD-10-CM | POA: Diagnosis not present

## 2019-08-09 ENCOUNTER — Ambulatory Visit (HOSPITAL_COMMUNITY)
Admission: RE | Admit: 2019-08-09 | Discharge: 2019-08-09 | Disposition: A | Discharge status: Home / Self Care | Payer: Medicare HMO | Source: Ambulatory Visit | Attending: Family Medicine | Admitting: Family Medicine

## 2019-08-09 ENCOUNTER — Other Ambulatory Visit: Payer: Self-pay

## 2019-08-09 DIAGNOSIS — R1310 Dysphagia, unspecified: Secondary | ICD-10-CM | POA: Insufficient documentation

## 2019-08-09 DIAGNOSIS — T17300A Unspecified foreign body in larynx causing asphyxiation, initial encounter: Secondary | ICD-10-CM | POA: Diagnosis not present

## 2019-08-09 DIAGNOSIS — M6281 Muscle weakness (generalized): Secondary | ICD-10-CM | POA: Diagnosis not present

## 2019-08-09 DIAGNOSIS — R296 Repeated falls: Secondary | ICD-10-CM | POA: Diagnosis not present

## 2019-08-09 DIAGNOSIS — R41841 Cognitive communication deficit: Secondary | ICD-10-CM | POA: Diagnosis not present

## 2019-08-09 DIAGNOSIS — R2689 Other abnormalities of gait and mobility: Secondary | ICD-10-CM | POA: Diagnosis not present

## 2019-08-09 DIAGNOSIS — R2681 Unsteadiness on feet: Secondary | ICD-10-CM | POA: Diagnosis not present

## 2019-08-09 DIAGNOSIS — R1312 Dysphagia, oropharyngeal phase: Secondary | ICD-10-CM | POA: Diagnosis not present

## 2019-08-09 NOTE — Progress Notes (Signed)
Modified Barium Swallow Progress Note  Patient Details  Name: William Cummings MRN: [id number] Date of Birth: Mar 10, 1941  Today's Date: 08/09/2019  Modified Barium Swallow completed.  Full report located under Chart Review in the Imaging Section.  Brief recommendations include the following:  Clinical Impression  Pt was seen for a modified barium swallow study and he presents with grossly functional oropharyngeal swallowing abilities.  One episode of deep laryngeal penetration that did not completely clear the laryngeal vestibule was observed x1 with nectar-thick liquid via cup sip.  Transient laryngeal penetration was also observed with thin liquid via cup sip, but this is functional for the pt's age group.  Laryngeal penetration occurred before the swallow and it was a result of delayed swallow initiation/delayed laryngeal closure.  Swallow initiation occurred at the level of the pyriform sinus with most liquid trials and it occurred at the level of the valleculae with solid trials.  No aspiration was observed with any trials and no laryngeal penetration was observed with thin liquid via tsp/straw, nectar-thick liquid via straw, puree, or regular solids.  Pt appeared to have better control with the straw as he consistently took smaller sips.  Mastication of regular solids was mildly prolonged, but it was functional and no oral residue was observed.  Pharyngeal phase was remarkable for reduced hyolaryngeal elevation/excursion which resulted in intermittent trace vallecular and pyriform residue.  Pt was able to clear residue with a spontaneous second swallow in most instances.  Pharyngoesophageal phase appeared to be unremarkable.  Recommend continuation of regular solids and thin liquids with the following compensatory strategies: 1) Small bites/sips 2) Slow rate of intake 3) Sit upright as possible.     Swallow Evaluation Recommendations       SLP Diet Recommendations: Thin liquid;Regular  solids   Liquid Administration via: Cup;Straw       Supervision: Patient able to self feed;Intermittent supervision to cue for compensatory strategies   Compensations: Minimize environmental distractions;Slow rate;Small sips/bites   Postural Changes: Seated upright at 90 degrees           Villa Herb., M.S., CCC-SLP Acute Rehabilitation Services Office: [phone]  Shanon Rosser Oziel Beitler 08/09/2019,12:53 PM

## 2019-08-10 DIAGNOSIS — M6281 Muscle weakness (generalized): Secondary | ICD-10-CM | POA: Diagnosis not present

## 2019-08-10 DIAGNOSIS — R1312 Dysphagia, oropharyngeal phase: Secondary | ICD-10-CM | POA: Diagnosis not present

## 2019-08-10 DIAGNOSIS — G20 Parkinson's disease: Secondary | ICD-10-CM | POA: Diagnosis not present

## 2019-08-10 DIAGNOSIS — R41841 Cognitive communication deficit: Secondary | ICD-10-CM | POA: Diagnosis not present

## 2019-08-10 DIAGNOSIS — R2681 Unsteadiness on feet: Secondary | ICD-10-CM | POA: Diagnosis not present

## 2019-08-10 DIAGNOSIS — R278 Other lack of coordination: Secondary | ICD-10-CM | POA: Diagnosis not present

## 2019-08-13 DIAGNOSIS — R2681 Unsteadiness on feet: Secondary | ICD-10-CM | POA: Diagnosis not present

## 2019-08-13 DIAGNOSIS — R278 Other lack of coordination: Secondary | ICD-10-CM | POA: Diagnosis not present

## 2019-08-13 DIAGNOSIS — G20 Parkinson's disease: Secondary | ICD-10-CM | POA: Diagnosis not present

## 2019-08-13 DIAGNOSIS — M6281 Muscle weakness (generalized): Secondary | ICD-10-CM | POA: Diagnosis not present

## 2019-08-13 DIAGNOSIS — R41841 Cognitive communication deficit: Secondary | ICD-10-CM | POA: Diagnosis not present

## 2019-08-13 DIAGNOSIS — R1312 Dysphagia, oropharyngeal phase: Secondary | ICD-10-CM | POA: Diagnosis not present

## 2019-08-13 DIAGNOSIS — R296 Repeated falls: Secondary | ICD-10-CM | POA: Diagnosis not present

## 2019-08-13 DIAGNOSIS — R2689 Other abnormalities of gait and mobility: Secondary | ICD-10-CM | POA: Diagnosis not present

## 2019-08-13 NOTE — Progress Notes (Signed)
Assessment/Plan:   1.  Parkinsons Disease  -tried to call [facility] for meds but no one answered.  Tried to get dosing schedule.  Previously I dosed carbidopa/levodopa as 2/1/2 because he was self administering meds and was skipping middle of the day dosages.  If this is still the schedule, I will change dosing to carbidopa/levodopa 25/100, 2/2/1  -continue carbidopa/levodopa 50/200 q hs 2.  PDD  -Patient lives at [facility] now.    -talked about writing his story with his family.  Son moving back to the [place] (to [place]) in near future 3.  Dysphagia  -Modified barium swallow done on August 09, 2019 was essentially unremarkable.  Patient did better with a straw than without a straw.  Subjective:   William Cummings was seen today in follow up for Parkinsons disease.  Patient's sister present and supplements the history.  I have actually not seen the patient in the office now since 2019.  My previous records were reviewed prior to todays visit as well as outside records available to me. Pt is in the emergency room on March 12 and March 24 after a fall.  These were not his only falls, and has been falling for the last few weeks prior to that.  That being said, the patient also had Covid in February.  CT brain was completed on July 18, 2019 and personally reviewed by myself.  There was moderate atrophy and small vessel disease.  Sister not sure why the falls happened except that he is in new place and didn't have his walker.  He was initially at independent living at [facility] but he is now in memory care and he has not fallen since they moved him to memory care.   He moved from [place] to [facility] when he got covid and need for higher level of care. Doing PT at [facility].  No hallucinations.  Sister unsure if doing speech therapy.  Is doing OT.  Pt did have a modified barium swallow completed on August 09, 2019.  This was unremarkable.    Current prescribed  movement disorder medications: carbidopa/levodopa 25/100, 2/1/2 carbidopa/levodopa 50/200 at bed   ALLERGIES:  No Known Allergies  CURRENT MEDICATIONS:  Outpatient Encounter Medications as of 08/14/2019  Medication Sig  . carbidopa-levodopa (SINEMET CR) 50-200 MG tablet Take 1 tablet by mouth at bedtime.  . carbidopa-levodopa (SINEMET IR) 25-100 MG tablet TAKE 2 TABLETS IN THE MORNING, 1 TABLET IN THE AFTERNOON AND 2 TABLETS IN THE EVENING (Patient taking differently: See admin instructions. TAKE 2 TABLETS IN THE MORNING, 1 TABLET IN THE AFTERNOON AND 2 TABLETS IN THE EVENING)  . sertraline (ZOLOFT) 100 MG tablet TAKE 2 TABLETS BY MOUTH EVERY DAY (Patient taking differently: Take 200 mg by mouth daily. )   No facility-administered encounter medications on file as of 08/14/2019.    Objective:   PHYSICAL EXAMINATION:    VITALS:   Vitals:   08/14/19 1309  BP: (!) 150/72  Pulse: 75  SpO2: 99%  Weight: 133 lb (60.3 kg)  Height: 5\' 7"  (1.702 m)    GEN:  The patient appears stated age and is in NAD. HEENT:  Normocephalic, atraumatic.  The mucous membranes are moist. The superficial temporal arteries are without ropiness or tenderness. CV:  RRR Lungs:  CTAB Neck/HEME:  There are no carotid bruits bilaterally.  Neurological examination:  Orientation: The patient is alert and oriented to person.  Knows its April.  Doesn't know  the year Cranial nerves: There is good facial symmetry with facial hypomimia. The speech is fluent and very hypophonic. Soft palate rises symmetrically and there is no tongue deviation. Hearing is intact to conversational tone. Sensation: Sensation is intact to light touch throughout Motor: Strength is at least antigravity x4.  Movement examination: Tone: There is normal tone in the UE/LE Abnormal movements: there is RUE rest tremor. Coordination:  There is  decremation with RAM's, with any form of RAMS, including alternating supination and pronation of the  forearm, hand opening and closing, finger taps, heel taps and toe taps. Gait and Station: The patient requires assist out of the chair.  He uses the examiner as a walker.  He is short stepped and freezes in the turn.      Total time spent on today's visit was 30 minutes, including both face-to-face time and nonface-to-face time.  Time included that spent on review of records (prior notes available to me/labs/imaging if pertinent), discussing treatment and goals, answering patient's questions and coordinating care.  Cc:  Deeann Saint, MD

## 2019-08-14 ENCOUNTER — Encounter: Payer: Self-pay | Admitting: Neurology

## 2019-08-14 ENCOUNTER — Other Ambulatory Visit: Payer: Self-pay

## 2019-08-14 ENCOUNTER — Ambulatory Visit: Payer: Medicare HMO | Admitting: Neurology

## 2019-08-14 VITALS — BP 150/72 | HR 75 | Ht 67.0 in | Wt 133.0 lb

## 2019-08-14 DIAGNOSIS — M6281 Muscle weakness (generalized): Secondary | ICD-10-CM | POA: Diagnosis not present

## 2019-08-14 DIAGNOSIS — R278 Other lack of coordination: Secondary | ICD-10-CM | POA: Diagnosis not present

## 2019-08-14 DIAGNOSIS — R2681 Unsteadiness on feet: Secondary | ICD-10-CM | POA: Diagnosis not present

## 2019-08-14 DIAGNOSIS — F0280 Dementia in other diseases classified elsewhere without behavioral disturbance: Secondary | ICD-10-CM | POA: Diagnosis not present

## 2019-08-14 DIAGNOSIS — R2689 Other abnormalities of gait and mobility: Secondary | ICD-10-CM | POA: Diagnosis not present

## 2019-08-14 DIAGNOSIS — R296 Repeated falls: Secondary | ICD-10-CM | POA: Diagnosis not present

## 2019-08-14 DIAGNOSIS — G20 Parkinson's disease: Secondary | ICD-10-CM

## 2019-08-14 NOTE — Patient Instructions (Signed)
1.  Awaiting med list update from [facility] - please ask them to fax it to [phone]

## 2019-08-15 DIAGNOSIS — R1312 Dysphagia, oropharyngeal phase: Secondary | ICD-10-CM | POA: Diagnosis not present

## 2019-08-15 DIAGNOSIS — Z1159 Encounter for screening for other viral diseases: Secondary | ICD-10-CM | POA: Diagnosis not present

## 2019-08-15 DIAGNOSIS — R41841 Cognitive communication deficit: Secondary | ICD-10-CM | POA: Diagnosis not present

## 2019-08-15 DIAGNOSIS — Z20828 Contact with and (suspected) exposure to other viral communicable diseases: Secondary | ICD-10-CM | POA: Diagnosis not present

## 2019-08-16 DIAGNOSIS — R2689 Other abnormalities of gait and mobility: Secondary | ICD-10-CM | POA: Diagnosis not present

## 2019-08-16 DIAGNOSIS — R296 Repeated falls: Secondary | ICD-10-CM | POA: Diagnosis not present

## 2019-08-16 DIAGNOSIS — R2681 Unsteadiness on feet: Secondary | ICD-10-CM | POA: Diagnosis not present

## 2019-08-16 DIAGNOSIS — M6281 Muscle weakness (generalized): Secondary | ICD-10-CM | POA: Diagnosis not present

## 2019-08-17 DIAGNOSIS — R1312 Dysphagia, oropharyngeal phase: Secondary | ICD-10-CM | POA: Diagnosis not present

## 2019-08-17 DIAGNOSIS — R41841 Cognitive communication deficit: Secondary | ICD-10-CM | POA: Diagnosis not present

## 2019-08-20 DIAGNOSIS — R2681 Unsteadiness on feet: Secondary | ICD-10-CM | POA: Diagnosis not present

## 2019-08-20 DIAGNOSIS — G20 Parkinson's disease: Secondary | ICD-10-CM | POA: Diagnosis not present

## 2019-08-20 DIAGNOSIS — R278 Other lack of coordination: Secondary | ICD-10-CM | POA: Diagnosis not present

## 2019-08-20 DIAGNOSIS — M6281 Muscle weakness (generalized): Secondary | ICD-10-CM | POA: Diagnosis not present

## 2019-08-22 DIAGNOSIS — Z20828 Contact with and (suspected) exposure to other viral communicable diseases: Secondary | ICD-10-CM | POA: Diagnosis not present

## 2019-08-22 DIAGNOSIS — R278 Other lack of coordination: Secondary | ICD-10-CM | POA: Diagnosis not present

## 2019-08-22 DIAGNOSIS — G20 Parkinson's disease: Secondary | ICD-10-CM | POA: Diagnosis not present

## 2019-08-22 DIAGNOSIS — Z1159 Encounter for screening for other viral diseases: Secondary | ICD-10-CM | POA: Diagnosis not present

## 2019-08-22 DIAGNOSIS — M6281 Muscle weakness (generalized): Secondary | ICD-10-CM | POA: Diagnosis not present

## 2019-08-22 DIAGNOSIS — R1312 Dysphagia, oropharyngeal phase: Secondary | ICD-10-CM | POA: Diagnosis not present

## 2019-08-22 DIAGNOSIS — R41841 Cognitive communication deficit: Secondary | ICD-10-CM | POA: Diagnosis not present

## 2019-08-22 DIAGNOSIS — R2681 Unsteadiness on feet: Secondary | ICD-10-CM | POA: Diagnosis not present

## 2019-08-23 DIAGNOSIS — R1312 Dysphagia, oropharyngeal phase: Secondary | ICD-10-CM | POA: Diagnosis not present

## 2019-08-23 DIAGNOSIS — R41841 Cognitive communication deficit: Secondary | ICD-10-CM | POA: Diagnosis not present

## 2019-08-24 DIAGNOSIS — R1312 Dysphagia, oropharyngeal phase: Secondary | ICD-10-CM | POA: Diagnosis not present

## 2019-08-24 DIAGNOSIS — R41841 Cognitive communication deficit: Secondary | ICD-10-CM | POA: Diagnosis not present

## 2019-08-27 DIAGNOSIS — R41841 Cognitive communication deficit: Secondary | ICD-10-CM | POA: Diagnosis not present

## 2019-08-27 DIAGNOSIS — R1312 Dysphagia, oropharyngeal phase: Secondary | ICD-10-CM | POA: Diagnosis not present

## 2019-08-29 DIAGNOSIS — Z1159 Encounter for screening for other viral diseases: Secondary | ICD-10-CM | POA: Diagnosis not present

## 2019-08-29 DIAGNOSIS — R41841 Cognitive communication deficit: Secondary | ICD-10-CM | POA: Diagnosis not present

## 2019-08-29 DIAGNOSIS — Z20828 Contact with and (suspected) exposure to other viral communicable diseases: Secondary | ICD-10-CM | POA: Diagnosis not present

## 2019-08-29 DIAGNOSIS — R1312 Dysphagia, oropharyngeal phase: Secondary | ICD-10-CM | POA: Diagnosis not present

## 2019-08-31 ENCOUNTER — Telehealth: Payer: Self-pay

## 2019-08-31 NOTE — Telephone Encounter (Signed)
Pt form from [facility] was signed and faxes, copy was send to scanning

## 2019-09-03 ENCOUNTER — Other Ambulatory Visit: Payer: Self-pay

## 2019-09-03 ENCOUNTER — Telehealth: Payer: Self-pay

## 2019-09-03 DIAGNOSIS — R1312 Dysphagia, oropharyngeal phase: Secondary | ICD-10-CM | POA: Diagnosis not present

## 2019-09-03 DIAGNOSIS — R41841 Cognitive communication deficit: Secondary | ICD-10-CM | POA: Diagnosis not present

## 2019-09-03 NOTE — Telephone Encounter (Signed)
Our office received several fax from pt residing facility William Cummings reporting of pt falls and pt complaints of right/left  hip pain, orders reviewed by Dr Salomon Fick completed and fax back to pt facility, copies send to scanning

## 2019-09-05 DIAGNOSIS — Z1159 Encounter for screening for other viral diseases: Secondary | ICD-10-CM | POA: Diagnosis not present

## 2019-09-05 DIAGNOSIS — R41841 Cognitive communication deficit: Secondary | ICD-10-CM | POA: Diagnosis not present

## 2019-09-05 DIAGNOSIS — M1611 Unilateral primary osteoarthritis, right hip: Secondary | ICD-10-CM | POA: Diagnosis not present

## 2019-09-05 DIAGNOSIS — R1312 Dysphagia, oropharyngeal phase: Secondary | ICD-10-CM | POA: Diagnosis not present

## 2019-09-05 DIAGNOSIS — Z20828 Contact with and (suspected) exposure to other viral communicable diseases: Secondary | ICD-10-CM | POA: Diagnosis not present

## 2019-09-06 ENCOUNTER — Other Ambulatory Visit: Payer: Medicare HMO

## 2019-09-06 ENCOUNTER — Other Ambulatory Visit: Payer: Self-pay

## 2019-09-06 ENCOUNTER — Ambulatory Visit (INDEPENDENT_AMBULATORY_CARE_PROVIDER_SITE_OTHER): Payer: Medicare HMO | Admitting: Family Medicine

## 2019-09-06 ENCOUNTER — Encounter: Payer: Self-pay | Admitting: Family Medicine

## 2019-09-06 ENCOUNTER — Ambulatory Visit (INDEPENDENT_AMBULATORY_CARE_PROVIDER_SITE_OTHER): Payer: Medicare HMO

## 2019-09-06 VITALS — BP 102/68 | HR 98 | Temp 98.1°F | Wt 136.0 lb

## 2019-09-06 DIAGNOSIS — R58 Hemorrhage, not elsewhere classified: Secondary | ICD-10-CM

## 2019-09-06 DIAGNOSIS — W19XXXA Unspecified fall, initial encounter: Secondary | ICD-10-CM

## 2019-09-06 DIAGNOSIS — F0280 Dementia in other diseases classified elsewhere without behavioral disturbance: Secondary | ICD-10-CM

## 2019-09-06 DIAGNOSIS — M25552 Pain in left hip: Secondary | ICD-10-CM

## 2019-09-06 DIAGNOSIS — G20 Parkinson's disease: Secondary | ICD-10-CM

## 2019-09-06 DIAGNOSIS — R6889 Other general symptoms and signs: Secondary | ICD-10-CM | POA: Diagnosis not present

## 2019-09-06 DIAGNOSIS — S79912A Unspecified injury of left hip, initial encounter: Secondary | ICD-10-CM | POA: Diagnosis not present

## 2019-09-06 NOTE — Patient Instructions (Signed)
Hip Pain The hip is the joint between the upper legs and the lower pelvis. The bones, cartilage, tendons, and muscles of your hip joint support your body and allow you to move around. Hip pain can range from a minor ache to severe pain in one or both of your hips. The pain may be felt on the inside of the hip joint near the groin, or on the outside near the buttocks and upper thigh. You may also have swelling or stiffness in your hip area. Follow these instructions at home: Managing pain, stiffness, and swelling      If directed, put ice on the painful area. To do this: ? Put ice in a plastic bag. ? Place a towel between your skin and the bag. ? Leave the ice on for 20 minutes, 2-3 times a day.  If directed, apply heat to the affected area as often as told by your health care provider. Use the heat source that your health care provider recommends, such as a moist heat pack or a heating pad. ? Place a towel between your skin and the heat source. ? Leave the heat on for 20-30 minutes. ? Remove the heat if your skin turns bright red. This is especially important if you are unable to feel pain, heat, or cold. You may have a greater risk of getting burned. Activity  Do exercises as told by your health care provider.  Avoid activities that cause pain. General instructions   Take over-the-counter and prescription medicines only as told by your health care provider.  Keep a journal of your symptoms. Write down: ? How often you have hip pain. ? The location of your pain. ? What the pain feels like. ? What makes the pain worse.  Sleep with a pillow between your legs on your most comfortable side.  Keep all follow-up visits as told by your health care provider. This is important. Contact a health care provider if:  You cannot put weight on your leg.  Your pain or swelling continues or gets worse after one week.  It gets harder to walk.  You have a fever. Get help right away  if:  You fall.  You have a sudden increase in pain and swelling in your hip.  Your hip is red or swollen or very tender to touch. Summary  Hip pain can range from a minor ache to severe pain in one or both of your hips.  The pain may be felt on the inside of the hip joint near the groin, or on the outside near the buttocks and upper thigh.  Avoid activities that cause pain.  Write down how often you have hip pain, the location of the pain, what makes it worse, and what it feels like. This information is not intended to replace advice given to you by your health care provider. Make sure you discuss any questions you have with your health care provider. Document Revised: 08/28/2018 Document Reviewed: 08/28/2018 Elsevier Patient Education  2020 [employer].  Fall Prevention in the Home, Adult Falls can cause injuries and can affect people from all age groups. There are many simple things that you can do to make your home safe and to help prevent falls. Ask for help when making these changes, if needed. What actions can I take to prevent falls? General instructions  Use good lighting in all rooms. Replace any light bulbs that burn out.  Turn on lights if it is dark. Use night-lights.  Place  frequently used items in easy-to-reach places. Lower the shelves around your home if necessary.  Set up furniture so that there are clear paths around it. Avoid moving your furniture around.  Remove throw rugs and other tripping hazards from the floor.  Avoid walking on wet floors.  Fix any uneven floor surfaces.  Add color or contrast paint or tape to grab bars and handrails in your home. Place contrasting color strips on the first and last steps of stairways.  When you use a stepladder, make sure that it is completely opened and that the sides are firmly locked. Have someone hold the ladder while you are using it. Do not climb a closed stepladder.  Be aware of any and all pets. What can  I do in the bathroom?      Keep the floor dry. Immediately clean up any water that spills onto the floor.  Remove soap buildup in the tub or shower on a regular basis.  Use non-skid mats or decals on the floor of the tub or shower.  Attach bath mats securely with double-sided, non-slip rug tape.  If you need to sit down while you are in the shower, use a plastic, non-slip stool.  Install grab bars by the toilet and in the tub and shower. Do not use towel bars as grab bars. What can I do in the bedroom?  Make sure that a bedside light is easy to reach.  Do not use oversized bedding that drapes onto the floor.  Have a firm chair that has side arms to use for getting dressed. What can I do in the kitchen?  Clean up any spills right away.  If you need to reach for something above you, use a sturdy step stool that has a grab bar.  Keep electrical cables out of the way.  Do not use floor polish or wax that makes floors slippery. If you must use wax, make sure that it is non-skid floor wax. What can I do in the stairways?  Do not leave any items on the stairs.  Make sure that you have a light switch at the top of the stairs and the bottom of the stairs. Have them installed if you do not have them.  Make sure that there are handrails on both sides of the stairs. Fix handrails that are broken or loose. Make sure that handrails are as long as the stairways.  Install non-slip stair treads on all stairs in your home.  Avoid having throw rugs at the top or bottom of stairways, or secure the rugs with carpet tape to prevent them from moving.  Choose a carpet design that does not hide the edge of steps on the stairway.  Check any carpeting to make sure that it is firmly attached to the stairs. Fix any carpet that is loose or worn. What can I do on the outside of my home?  Use bright outdoor lighting.  Regularly repair the edges of walkways and driveways and fix any  cracks.  Remove high doorway thresholds.  Trim any shrubbery on the main path into your home.  Regularly check that handrails are securely fastened and in good repair. Both sides of any steps should have handrails.  Install guardrails along the edges of any raised decks or porches.  Clear walkways of debris and clutter, including tools and rocks.  Have leaves, snow, and ice cleared regularly.  Use sand or salt on walkways during winter months.  In the garage, clean  up any spills right away, including grease or oil spills. What other actions can I take?  Wear closed-toe shoes that fit well and support your feet. Wear shoes that have rubber soles or low heels.  Use mobility aids as needed, such as canes, walkers, scooters, and crutches.  Review your medicines with your health care provider. Some medicines can cause dizziness or changes in blood pressure, which increase your risk of falling. Talk with your health care provider about other ways that you can decrease your risk of falls. This may include working with a physical therapist or trainer to improve your strength, balance, and endurance. Where to find more information  Centers for Disease Control and Prevention, STEADI: WebmailGuide.co.za  [employer] on Aging: BrainJudge.co.uk Contact a health care provider if:  You are afraid of falling at home.  You feel weak, drowsy, or dizzy at home.  You fall at home. Summary  There are many simple things that you can do to make your home safe and to help prevent falls.  Ways to make your home safe include removing tripping hazards and installing grab bars in the bathroom.  Ask for help when making these changes in your home. This information is not intended to replace advice given to you by your health care provider. Make sure you discuss any questions you have with your health care provider. Document Revised: 03/25/2017 Document Reviewed:  11/25/2016 Elsevier Patient Education  2020 [employer].

## 2019-09-06 NOTE — Progress Notes (Signed)
Subjective:    Patient ID: William Cummings, male    DOB: [birth year], 79 y.o.   MRN: [id number]  No chief complaint on file. Pt accompanied by his sister, William Cummings.  HPI Patient was seen today for f/u.  Pt s/p several unwitnessed falls while at memory care unit at [facility].  Pt endorses L hip pain with standing.  Denies inability to put weight on LLE, hitting head, or LOC.  Past Medical History:  Diagnosis Date  . Dementia (HCC)   . Depression   . Parkinson's disease (HCC)     No Known Allergies  ROS  H/o Parkinson's dementia but able to answer some questions  General: Denies fever, chills, night sweats, changes in weight, changes in appetite HEENT: Denies headaches, ear pain, changes in vision, rhinorrhea, sore throat CV: Denies CP, palpitations, SOB, orthopnea Pulm: Denies SOB, cough, wheezing GI: Denies abdominal pain, nausea, vomiting, diarrhea, constipation GU: Denies dysuria, hematuria, frequency Msk: Denies muscle cramps  + L hip pain Neuro: Denies weakness, numbness, tingling Skin: Denies rashes, bruising Psych: Denies depression, anxiety, hallucinations     Objective:    Blood pressure 102/68, pulse 98, temperature 98.1 F (36.7 C), temperature source Temporal, weight 136 lb (61.7 kg), SpO2 100 %.  Gen. Pleasant, well-nourished, in no distress, normal affect   HEENT: [place]/AT, face symmetric, conjunctiva clear, no scleral icterus, PERRLA, EOMI, nares patent without drainage Lungs: no accessory muscle use, CTAB, no wheezes or rales Cardiovascular: RRR, no m/r/g, no peripheral edema Musculoskeletal: TTP of L buttock and lateral L hip.  No deformities, no cyanosis or clubbing, normal tone Neuro:  A&Ox3, CN II-XII intact, sitting in wheelchair, gait not assessed, but able to stand. Skin:  Warm, no lesions/ rash.  Several areas of ecchymosis and enduration of L buttock   Wt Readings from Last 3 Encounters:  09/06/19 136 lb (61.7 kg)  08/14/19 133 lb (60.3  kg)  07/06/19 127 lb 3 oz (57.7 kg)    Lab Results  Component Value Date   WBC 5.4 07/18/2019   HGB 11.4 (L) 07/18/2019   HCT 34.3 (L) 07/18/2019   PLT 199 07/18/2019   GLUCOSE 99 07/18/2019   ALT 6 07/18/2019   AST 21 07/18/2019   NA 138 07/18/2019   K 4.5 07/18/2019   CL 106 07/18/2019   CREATININE 0.97 07/18/2019   BUN 23 07/18/2019   CO2 26 07/18/2019    Assessment/Plan:  Acute hip pain, left  -given recurrent falls will proceed with imaging -supportive care: ice,/heat, massage - Plan: DG Hip Unilat W OR W/O Pelvis 2-3 Views Left  Fall from standing, initial encounter -recent UA negative -Recent CT reassuring.  CT head 07/06/19 with small atrophy and chronic small vessel ischemia, stable degenerative change in cervical spine without acute fx or subluxation.   -concern for worsening Parkinson's dz.  Consider other causes of falls such as UTI or other infection -consider PT. -continue f/u with Neurology  Ecchymosis -2/2 repeated falls -supportive care  Dementia associated with Parkinson's disease (HCC) -stable -continue sinemet-CR 50-200 mg qhs and sinemet IR 25-100 mg 2 tabs in am, 1 tab in afternoon, 2 tabs in evening. -continue f/u with Neruology  Update:  Xray L hip negative. Continue supportive care.  F/u prn  Abbe Amsterdam, MD

## 2019-09-07 DIAGNOSIS — R1312 Dysphagia, oropharyngeal phase: Secondary | ICD-10-CM | POA: Diagnosis not present

## 2019-09-07 DIAGNOSIS — R41841 Cognitive communication deficit: Secondary | ICD-10-CM | POA: Diagnosis not present

## 2019-09-10 DIAGNOSIS — R41841 Cognitive communication deficit: Secondary | ICD-10-CM | POA: Diagnosis not present

## 2019-09-10 DIAGNOSIS — R1312 Dysphagia, oropharyngeal phase: Secondary | ICD-10-CM | POA: Diagnosis not present

## 2019-09-12 DIAGNOSIS — Z1159 Encounter for screening for other viral diseases: Secondary | ICD-10-CM | POA: Diagnosis not present

## 2019-09-12 DIAGNOSIS — Z20828 Contact with and (suspected) exposure to other viral communicable diseases: Secondary | ICD-10-CM | POA: Diagnosis not present

## 2019-09-14 DIAGNOSIS — R1312 Dysphagia, oropharyngeal phase: Secondary | ICD-10-CM | POA: Diagnosis not present

## 2019-09-14 DIAGNOSIS — R41841 Cognitive communication deficit: Secondary | ICD-10-CM | POA: Diagnosis not present

## 2019-09-19 DIAGNOSIS — Z20828 Contact with and (suspected) exposure to other viral communicable diseases: Secondary | ICD-10-CM | POA: Diagnosis not present

## 2019-09-19 DIAGNOSIS — Z1159 Encounter for screening for other viral diseases: Secondary | ICD-10-CM | POA: Diagnosis not present

## 2019-09-26 DIAGNOSIS — Z1159 Encounter for screening for other viral diseases: Secondary | ICD-10-CM | POA: Diagnosis not present

## 2019-09-26 DIAGNOSIS — Z20828 Contact with and (suspected) exposure to other viral communicable diseases: Secondary | ICD-10-CM | POA: Diagnosis not present

## 2019-10-01 NOTE — Telephone Encounter (Signed)
[facility] stated they have not received this from and would like it re-faxed at the number listed blow and today if possible.    Phone: [phone]  Fax: [phone]

## 2019-10-02 NOTE — Telephone Encounter (Signed)
William Cummings has been trying to get some PT orders signed and returned to her as soon as possible.   William Cummings state that she has been sending over faxes since Aug 27, 2019 and has not received any back and would like to see if they can be faxed to [phone].  I advised if she has not received any back she may want to refax but she wanted to wait to see if she gets any back after calling today.

## 2019-10-03 DIAGNOSIS — Z1159 Encounter for screening for other viral diseases: Secondary | ICD-10-CM | POA: Diagnosis not present

## 2019-10-03 DIAGNOSIS — Z20828 Contact with and (suspected) exposure to other viral communicable diseases: Secondary | ICD-10-CM | POA: Diagnosis not present

## 2019-10-03 NOTE — Telephone Encounter (Signed)
Pt physical therapy orders have been faxed to [facility] to attention Rose as requested, confirmation received, attempted to call to check if fax was received but office closed

## 2019-10-05 DIAGNOSIS — R2681 Unsteadiness on feet: Secondary | ICD-10-CM | POA: Diagnosis not present

## 2019-10-05 DIAGNOSIS — M6281 Muscle weakness (generalized): Secondary | ICD-10-CM | POA: Diagnosis not present

## 2019-10-05 DIAGNOSIS — R296 Repeated falls: Secondary | ICD-10-CM | POA: Diagnosis not present

## 2019-10-05 DIAGNOSIS — R278 Other lack of coordination: Secondary | ICD-10-CM | POA: Diagnosis not present

## 2019-10-10 DIAGNOSIS — R2681 Unsteadiness on feet: Secondary | ICD-10-CM | POA: Diagnosis not present

## 2019-10-10 DIAGNOSIS — Z1159 Encounter for screening for other viral diseases: Secondary | ICD-10-CM | POA: Diagnosis not present

## 2019-10-10 DIAGNOSIS — M6281 Muscle weakness (generalized): Secondary | ICD-10-CM | POA: Diagnosis not present

## 2019-10-10 DIAGNOSIS — Z20828 Contact with and (suspected) exposure to other viral communicable diseases: Secondary | ICD-10-CM | POA: Diagnosis not present

## 2019-10-12 DIAGNOSIS — R278 Other lack of coordination: Secondary | ICD-10-CM | POA: Diagnosis not present

## 2019-10-12 DIAGNOSIS — M6281 Muscle weakness (generalized): Secondary | ICD-10-CM | POA: Diagnosis not present

## 2019-10-12 DIAGNOSIS — R2681 Unsteadiness on feet: Secondary | ICD-10-CM | POA: Diagnosis not present

## 2019-10-12 DIAGNOSIS — R296 Repeated falls: Secondary | ICD-10-CM | POA: Diagnosis not present

## 2019-10-15 ENCOUNTER — Other Ambulatory Visit: Payer: Self-pay

## 2019-10-15 ENCOUNTER — Emergency Department (HOSPITAL_COMMUNITY)
Admission: EM | Admit: 2019-10-15 | Discharge: 2019-10-15 | Disposition: A | Discharge status: Home / Self Care | Payer: Medicare HMO | Attending: Emergency Medicine | Admitting: Emergency Medicine

## 2019-10-15 ENCOUNTER — Emergency Department (HOSPITAL_COMMUNITY): Payer: Medicare HMO

## 2019-10-15 ENCOUNTER — Encounter (HOSPITAL_COMMUNITY): Payer: Self-pay | Admitting: Emergency Medicine

## 2019-10-15 DIAGNOSIS — S299XXA Unspecified injury of thorax, initial encounter: Secondary | ICD-10-CM | POA: Diagnosis not present

## 2019-10-15 DIAGNOSIS — F29 Unspecified psychosis not due to a substance or known physiological condition: Secondary | ICD-10-CM | POA: Diagnosis not present

## 2019-10-15 DIAGNOSIS — Z79899 Other long term (current) drug therapy: Secondary | ICD-10-CM | POA: Insufficient documentation

## 2019-10-15 DIAGNOSIS — W19XXXA Unspecified fall, initial encounter: Secondary | ICD-10-CM | POA: Insufficient documentation

## 2019-10-15 DIAGNOSIS — F0390 Unspecified dementia without behavioral disturbance: Secondary | ICD-10-CM | POA: Insufficient documentation

## 2019-10-15 DIAGNOSIS — Y939 Activity, unspecified: Secondary | ICD-10-CM | POA: Diagnosis not present

## 2019-10-15 DIAGNOSIS — G20 Parkinson's disease: Secondary | ICD-10-CM | POA: Diagnosis not present

## 2019-10-15 DIAGNOSIS — S0990XA Unspecified injury of head, initial encounter: Secondary | ICD-10-CM | POA: Insufficient documentation

## 2019-10-15 DIAGNOSIS — Y92009 Unspecified place in unspecified non-institutional (private) residence as the place of occurrence of the external cause: Secondary | ICD-10-CM | POA: Diagnosis not present

## 2019-10-15 DIAGNOSIS — S79912A Unspecified injury of left hip, initial encounter: Secondary | ICD-10-CM | POA: Diagnosis not present

## 2019-10-15 DIAGNOSIS — R4182 Altered mental status, unspecified: Secondary | ICD-10-CM | POA: Diagnosis not present

## 2019-10-15 DIAGNOSIS — Y999 Unspecified external cause status: Secondary | ICD-10-CM | POA: Diagnosis not present

## 2019-10-15 DIAGNOSIS — M6281 Muscle weakness (generalized): Secondary | ICD-10-CM | POA: Diagnosis not present

## 2019-10-15 DIAGNOSIS — R404 Transient alteration of awareness: Secondary | ICD-10-CM | POA: Diagnosis not present

## 2019-10-15 DIAGNOSIS — Z743 Need for continuous supervision: Secondary | ICD-10-CM | POA: Diagnosis not present

## 2019-10-15 DIAGNOSIS — R278 Other lack of coordination: Secondary | ICD-10-CM | POA: Diagnosis not present

## 2019-10-15 DIAGNOSIS — R279 Unspecified lack of coordination: Secondary | ICD-10-CM | POA: Diagnosis not present

## 2019-10-15 DIAGNOSIS — R410 Disorientation, unspecified: Secondary | ICD-10-CM | POA: Diagnosis not present

## 2019-10-15 DIAGNOSIS — R296 Repeated falls: Secondary | ICD-10-CM | POA: Diagnosis not present

## 2019-10-15 DIAGNOSIS — M542 Cervicalgia: Secondary | ICD-10-CM | POA: Diagnosis not present

## 2019-10-15 DIAGNOSIS — R519 Headache, unspecified: Secondary | ICD-10-CM | POA: Diagnosis not present

## 2019-10-15 LAB — CBC WITH DIFFERENTIAL/PLATELET
Abs Immature Granulocytes: 0.01 10*3/uL (ref 0.00–0.07)
Basophils Absolute: 0 10*3/uL (ref 0.0–0.1)
Basophils Relative: 1 %
Eosinophils Absolute: 0.1 10*3/uL (ref 0.0–0.5)
Eosinophils Relative: 2 %
HCT: 40.7 % (ref 39.0–52.0)
Hemoglobin: 13.1 g/dL (ref 13.0–17.0)
Immature Granulocytes: 0 %
Lymphocytes Relative: 22 %
Lymphs Abs: 0.9 10*3/uL (ref 0.7–4.0)
MCH: 30.8 pg (ref 26.0–34.0)
MCHC: 32.2 g/dL (ref 30.0–36.0)
MCV: 95.8 fL (ref 80.0–100.0)
Monocytes Absolute: 0.4 10*3/uL (ref 0.1–1.0)
Monocytes Relative: 10 %
Neutro Abs: 2.8 10*3/uL (ref 1.7–7.7)
Neutrophils Relative %: 65 %
Platelets: 182 10*3/uL (ref 150–400)
RBC: 4.25 MIL/uL (ref 4.22–5.81)
RDW: 12.9 % (ref 11.5–15.5)
WBC: 4.3 10*3/uL (ref 4.0–10.5)
nRBC: 0 % (ref 0.0–0.2)

## 2019-10-15 LAB — URINALYSIS, ROUTINE W REFLEX MICROSCOPIC
Bilirubin Urine: NEGATIVE
Glucose, UA: NEGATIVE mg/dL
Hgb urine dipstick: NEGATIVE
Ketones, ur: NEGATIVE mg/dL
Leukocytes,Ua: NEGATIVE
Nitrite: NEGATIVE
Protein, ur: NEGATIVE mg/dL
Specific Gravity, Urine: 1.01 (ref 1.005–1.030)
pH: 6 (ref 5.0–8.0)

## 2019-10-15 LAB — BASIC METABOLIC PANEL
Anion gap: 11 (ref 5–15)
BUN: 19 mg/dL (ref 8–23)
CO2: 23 mmol/L (ref 22–32)
Calcium: 9 mg/dL (ref 8.9–10.3)
Chloride: 102 mmol/L (ref 98–111)
Creatinine, Ser: 0.72 mg/dL (ref 0.61–1.24)
GFR calc Af Amer: >60 mL/min (ref >60)
GFR calc non Af Amer: >60 mL/min (ref >60)
Glucose, Bld: 89 mg/dL (ref 70–99)
Potassium: 4.5 mmol/L (ref 3.5–5.1)
Sodium: 136 mmol/L (ref 135–145)

## 2019-10-15 NOTE — ED Provider Notes (Signed)
[facility] DEPT Provider Note   CSN: [id number] Arrival date & time: 10/15/19  1044     History Chief Complaint  Patient presents with  . Fall    William Cummings is a 79 y.o. male.  Presents to ER after unwitnessed fall.  Patient has history of Parkinson's disease, dementia per chart review.  Came from nursing facility.  Patient unable to provide any additional history due to his baseline mental status.  Level 5 history caveat.  Per EMS report:  Pt BIBA from [facility]  Per EMS- Pt with unwitnessed fall, found by staff 0930 today.  Staff reports pt reports hitting head, no visual trauma noted.  Pt does not take blood thinners, pt is at baseline per staff.    HPI     Past Medical History:  Diagnosis Date  . Dementia ([place])   . Depression   . Parkinson's disease [facility])     Patient Active Problem List   Diagnosis Date Noted  . Dementia associated with Parkinson's disease ([place]) 10/09/2018  . Parkinson's disease ([place]) 08/03/2017  . Depression, recurrent ([place]) 08/03/2017    History reviewed. No pertinent surgical history.     Family History  Problem Relation Age of Onset  . Heart attack Father   . Cancer Sister   . Early death Sister   . Depression Brother     Social History   Tobacco Use  . Smoking status: Never Smoker  . Smokeless tobacco: Never Used  Vaping Use  . Vaping Use: Never used  Substance Use Topics  . Alcohol use: Not Currently    Alcohol/week: 0.0 standard drinks    Comment: glass of wine once every couple months  . Drug use: No    Home Medications Prior to Admission medications   Medication Sig Start Date End Date Taking? Authorizing Provider  carbidopa-levodopa (SINEMET CR) 50-200 MG tablet Take 1 tablet by mouth at bedtime. 05/08/19   Tat, Eustace Quail, DO  carbidopa-levodopa (SINEMET IR) 25-100 MG tablet TAKE 2 TABLETS IN THE MORNING, 1 TABLET IN THE AFTERNOON AND 2 TABLETS IN THE  EVENING Patient taking differently: See admin instructions. TAKE 2 TABLETS IN THE MORNING, 1 TABLET IN THE AFTERNOON AND 2 TABLETS IN THE EVENING 05/07/19   Tat, Eustace Quail, DO  sertraline (ZOLOFT) 100 MG tablet TAKE 2 TABLETS BY MOUTH EVERY DAY Patient taking differently: Take 200 mg by mouth daily.  11/02/18   [name], MD    Allergies    Patient has no known allergies.  Review of Systems   Review of Systems  Unable to perform ROS: Dementia    Physical Exam Updated Vital Signs BP (!) 157/73   Pulse (!) 54   Temp 97.6 F (36.4 C) (Oral)   Resp 17   Ht 5\' 7"  (1.702 m)   Wt 61 kg   SpO2 100%   BMI 21.06 kg/m   Physical Exam Vitals and nursing note reviewed.  Constitutional:      Appearance: He is well-developed.     Comments: Pleasantly demented  HENT:     Head: Normocephalic and atraumatic.  Eyes:     Conjunctiva/sclera: Conjunctivae normal.  Cardiovascular:     Rate and Rhythm: Normal rate and regular rhythm.     Heart sounds: No murmur heard.   Pulmonary:     Effort: Pulmonary effort is normal. No respiratory distress.     Breath sounds: Normal breath sounds.  Abdominal:  Palpations: Abdomen is soft.     Tenderness: There is no abdominal tenderness.  Musculoskeletal:     Cervical back: Neck supple.     Comments: Back: no C, T, L spine TTP, no step off or deformity RUE: no TTP throughout, no deformity, normal joint ROM, radial pulse intact, distal sensation and motor intact LUE: no TTP throughout, no deformity, normal joint ROM, radial pulse intact, distal sensation and motor intact RLE:  no TTP throughout, no deformity, normal joint ROM, distal pulse, sensation and motor intact LLE: some TTP over left hip, no obvious deformity, normal joint ROM, distal pulse, sensation and motor intact  Skin:    General: Skin is warm and dry.     Capillary Refill: Capillary refill takes less than 2 seconds.  Neurological:     Mental Status: He is alert.     Comments:  Pleasantly demented     ED Results / Procedures / Treatments   Labs (all labs ordered are listed, but only abnormal results are displayed) Labs Reviewed  CBC WITH DIFFERENTIAL/PLATELET  BASIC METABOLIC PANEL  URINALYSIS, ROUTINE W REFLEX MICROSCOPIC    EKG EKG Interpretation  Date/Time:  Monday October 15 2019 11:02:32 EDT Ventricular Rate:  59 PR Interval:    QRS Duration: 140 QT Interval:  442 QTC Calculation: 438 R Axis:   -68 Text Interpretation: Sinus rhythm RBBB and LAFB Left ventricular hypertrophy Confirmed by Marianna Fuss ([id number]) on 10/15/2019 1:08:02 PM   Radiology DG Chest 1 View  Result Date: 10/15/2019 CLINICAL DATA:  Chest and hip pain after a fall today. EXAM: CHEST  1 VIEW COMPARISON:  Single-view of the chest 07/06/2019. FINDINGS: Lungs clear. Heart size normal. Atherosclerosis noted. No pneumothorax or pleural fluid. No acute or focal bony abnormality. IMPRESSION: No acute disease. Aortic Atherosclerosis (ICD10-I70.0). Electronically Signed   By: Drusilla Kanner M.D.   On: 10/15/2019 12:17   CT Head Wo Contrast  Result Date: 10/15/2019 CLINICAL DATA:  Un witnessed fall with headaches EXAM: CT HEAD WITHOUT CONTRAST TECHNIQUE: Contiguous axial images were obtained from the base of the skull through the vertex without intravenous contrast. COMPARISON:  07/18/2019 FINDINGS: Brain: Mild chronic white matter ischemic change is noted as well as mild atrophic changes. The overall appearance is similar to that seen on the prior exam. No findings to suggest acute hemorrhage, acute infarction or space-occupying mass lesion are noted. Vascular: No hyperdense vessel or unexpected calcification. Skull: Normal. Negative for fracture or focal lesion. Sinuses/Orbits: No acute finding. Other: None. IMPRESSION: Chronic atrophic and ischemic changes without acute abnormality. No significant change from the prior exam. Electronically Signed   By: Alcide Clever M.D.   On: 10/15/2019  12:07   CT Cervical Spine Wo Contrast  Result Date: 10/15/2019 CLINICAL DATA:  Acute neck pain, fall, unwitnessed fall, found by staff at 0930 hours, patient reports hitting head, history of Parkinson's disease/dementia EXAM: CT CERVICAL SPINE WITHOUT CONTRAST TECHNIQUE: Multidetector CT imaging of the cervical spine was performed without intravenous contrast. Multiplanar CT image reconstructions were also generated. CT head reported separately COMPARISON:  07/18/2019 FINDINGS: Alignment: Normal Skull base and vertebrae: Osseous mineralization normal. Visualized skull base intact. Scattered disc space narrowing and endplate spur formation. Multilevel facet degenerative changes. Encroachment upon LEFT cervical neural foramina at C4-C5 and C5-C6 by uncovertebral spurs. Crow chin upon RIGHT cervical foramina at multiple levels by combinations of uncovertebral and facet hypertrophy. Vertebral body heights maintained. No fracture, subluxation or bone destruction. Soft tissues and spinal canal: Prevertebral  soft tissues normal thickness. Remaining cervical soft tissues unremarkable. Scattered atherosclerotic calcifications in the carotid systems bilaterally. Disc levels: Bulging discs versus broad-based disc herniations at C3-C4 and C4-C5 Upper chest: Lung apices clear Other: N/A IMPRESSION: Multilevel degenerative disc and facet disease changes of the cervical spine as above. Bulging discs versus by broad-based disc herniations at C3-C4 and C4-C5. No acute cervical spine abnormalities. Electronically Signed   By: Ulyses Southward M.D.   On: 10/15/2019 12:11   DG Hip Unilat W or Wo Pelvis 2-3 Views Left  Result Date: 10/15/2019 CLINICAL DATA:  Left hip pain after a fall this morning. Initial encounter. EXAM: DG HIP (WITH OR WITHOUT PELVIS) 2-3V LEFT COMPARISON:  Plain films left hip 09/06/2019 FINDINGS: There is no acute bony or joint abnormality. Mild to moderate bilateral hip osteoarthritis is worse on the left. The  patient is status post lower lumbar fusion. There is some degenerative change about the SI joints. Soft tissue calcifications in the right buttock are consistent with some remote injury and unchanged. IMPRESSION: No acute abnormality. Electronically Signed   By: Drusilla Kanner M.D.   On: 10/15/2019 12:18    Procedures Procedures (including critical care time)  Medications Ordered in ED Medications - No data to display  ED Course  I have reviewed the triage vital signs and the nursing notes.  Pertinent labs & imaging results that were available during my care of the patient were reviewed by me and considered in my medical decision making (see chart for details).    MDM Rules/Calculators/A&P                          79 year old male with dementia unwitnessed fall, concern for possible head trauma.  Given fall was unwitnessed, obtain EKG and basic labs, UA, these were grossly within normal limits, no acute changes identified.  For trauma work-up, obtain CT head and C-spine.  Did note some tenderness in his left hip but no other deformity or tenderness was appreciated on my exam.  Plain films of left hip screening chest x-ray were negative.  Given work-up today, patient will be discharged back to facility.    After the discussed management above, the patient was determined to be safe for discharge.  The patient was in agreement with this plan and all questions regarding their care were answered.  ED return precautions were discussed and the patient will return to the ED with any significant worsening of condition.   Final Clinical Impression(s) / ED Diagnoses Final diagnoses:  Fall, initial encounter    Rx / DC Orders ED Discharge Orders    None       Milagros Loll, MD 10/16/19 [phone]

## 2019-10-15 NOTE — ED Triage Notes (Signed)
Pt BIBA from [facility]   Per EMS- Pt with unwitnessed fall, found by staff 0930 today.  Staff reports pt reports hitting head, no visual trauma noted.   Pt does not take blood thinners, pt is at baseline per staff.

## 2019-10-15 NOTE — Discharge Instructions (Signed)
Schedule follow-up appointment with primary doctor regarding falls.  Return to ER for any additional falls, chest pain, difficulty breathing or other new concerning symptom.

## 2019-10-15 NOTE — ED Notes (Signed)
[facility] called for transport. [facility] called and notified of patient's return.

## 2019-10-16 DIAGNOSIS — R2681 Unsteadiness on feet: Secondary | ICD-10-CM | POA: Diagnosis not present

## 2019-10-16 DIAGNOSIS — M6281 Muscle weakness (generalized): Secondary | ICD-10-CM | POA: Diagnosis not present

## 2019-10-17 DIAGNOSIS — R278 Other lack of coordination: Secondary | ICD-10-CM | POA: Diagnosis not present

## 2019-10-17 DIAGNOSIS — M6281 Muscle weakness (generalized): Secondary | ICD-10-CM | POA: Diagnosis not present

## 2019-10-17 DIAGNOSIS — Z1159 Encounter for screening for other viral diseases: Secondary | ICD-10-CM | POA: Diagnosis not present

## 2019-10-17 DIAGNOSIS — R296 Repeated falls: Secondary | ICD-10-CM | POA: Diagnosis not present

## 2019-10-17 DIAGNOSIS — Z20828 Contact with and (suspected) exposure to other viral communicable diseases: Secondary | ICD-10-CM | POA: Diagnosis not present

## 2019-10-19 DIAGNOSIS — M6281 Muscle weakness (generalized): Secondary | ICD-10-CM | POA: Diagnosis not present

## 2019-10-19 DIAGNOSIS — R296 Repeated falls: Secondary | ICD-10-CM | POA: Diagnosis not present

## 2019-10-19 DIAGNOSIS — R2681 Unsteadiness on feet: Secondary | ICD-10-CM | POA: Diagnosis not present

## 2019-10-19 DIAGNOSIS — R278 Other lack of coordination: Secondary | ICD-10-CM | POA: Diagnosis not present

## 2019-10-20 DIAGNOSIS — R2681 Unsteadiness on feet: Secondary | ICD-10-CM | POA: Diagnosis not present

## 2019-10-20 DIAGNOSIS — M6281 Muscle weakness (generalized): Secondary | ICD-10-CM | POA: Diagnosis not present

## 2019-10-22 DIAGNOSIS — R278 Other lack of coordination: Secondary | ICD-10-CM | POA: Diagnosis not present

## 2019-10-22 DIAGNOSIS — R296 Repeated falls: Secondary | ICD-10-CM | POA: Diagnosis not present

## 2019-10-22 DIAGNOSIS — R2681 Unsteadiness on feet: Secondary | ICD-10-CM | POA: Diagnosis not present

## 2019-10-22 DIAGNOSIS — M6281 Muscle weakness (generalized): Secondary | ICD-10-CM | POA: Diagnosis not present

## 2019-10-23 ENCOUNTER — Telehealth: Payer: Self-pay | Admitting: Family Medicine

## 2019-10-23 DIAGNOSIS — R296 Repeated falls: Secondary | ICD-10-CM | POA: Diagnosis not present

## 2019-10-23 DIAGNOSIS — R278 Other lack of coordination: Secondary | ICD-10-CM | POA: Diagnosis not present

## 2019-10-23 DIAGNOSIS — M6281 Muscle weakness (generalized): Secondary | ICD-10-CM | POA: Diagnosis not present

## 2019-10-23 NOTE — Telephone Encounter (Signed)
Charity with [facility], would like a new order saying, that pt needs skill nursing facility. Per [facility], pt is falling a lot. Charity, faxed over some orders a few days ago and it's okay, to write the skill nursing facility on that order. [employer] [phone]

## 2019-10-24 DIAGNOSIS — M6281 Muscle weakness (generalized): Secondary | ICD-10-CM | POA: Diagnosis not present

## 2019-10-24 DIAGNOSIS — R2681 Unsteadiness on feet: Secondary | ICD-10-CM | POA: Diagnosis not present

## 2019-10-24 DIAGNOSIS — Z20828 Contact with and (suspected) exposure to other viral communicable diseases: Secondary | ICD-10-CM | POA: Diagnosis not present

## 2019-10-24 DIAGNOSIS — Z1159 Encounter for screening for other viral diseases: Secondary | ICD-10-CM | POA: Diagnosis not present

## 2019-10-24 NOTE — Telephone Encounter (Signed)
Please advise 

## 2019-10-25 DIAGNOSIS — R296 Repeated falls: Secondary | ICD-10-CM | POA: Diagnosis not present

## 2019-10-25 DIAGNOSIS — M6281 Muscle weakness (generalized): Secondary | ICD-10-CM | POA: Diagnosis not present

## 2019-10-25 DIAGNOSIS — R278 Other lack of coordination: Secondary | ICD-10-CM | POA: Diagnosis not present

## 2019-10-26 DIAGNOSIS — R2681 Unsteadiness on feet: Secondary | ICD-10-CM | POA: Diagnosis not present

## 2019-10-26 DIAGNOSIS — M6281 Muscle weakness (generalized): Secondary | ICD-10-CM | POA: Diagnosis not present

## 2019-10-30 DIAGNOSIS — R296 Repeated falls: Secondary | ICD-10-CM | POA: Diagnosis not present

## 2019-10-30 DIAGNOSIS — M6281 Muscle weakness (generalized): Secondary | ICD-10-CM | POA: Diagnosis not present

## 2019-10-30 DIAGNOSIS — R278 Other lack of coordination: Secondary | ICD-10-CM | POA: Diagnosis not present

## 2019-10-30 NOTE — Telephone Encounter (Signed)
Please advise 

## 2019-10-31 ENCOUNTER — Emergency Department (HOSPITAL_COMMUNITY)
Admission: EM | Admit: 2019-10-31 | Discharge: 2019-11-01 | Disposition: A | Discharge status: Home / Self Care | Payer: Medicare HMO | Attending: Emergency Medicine | Admitting: Emergency Medicine

## 2019-10-31 ENCOUNTER — Other Ambulatory Visit: Payer: Self-pay

## 2019-10-31 ENCOUNTER — Emergency Department (HOSPITAL_COMMUNITY): Payer: Medicare HMO

## 2019-10-31 ENCOUNTER — Encounter (HOSPITAL_COMMUNITY): Payer: Self-pay

## 2019-10-31 DIAGNOSIS — Y92009 Unspecified place in unspecified non-institutional (private) residence as the place of occurrence of the external cause: Secondary | ICD-10-CM | POA: Insufficient documentation

## 2019-10-31 DIAGNOSIS — R4182 Altered mental status, unspecified: Secondary | ICD-10-CM | POA: Diagnosis not present

## 2019-10-31 DIAGNOSIS — W19XXXA Unspecified fall, initial encounter: Secondary | ICD-10-CM | POA: Insufficient documentation

## 2019-10-31 DIAGNOSIS — Z1159 Encounter for screening for other viral diseases: Secondary | ICD-10-CM | POA: Diagnosis not present

## 2019-10-31 DIAGNOSIS — Y9389 Activity, other specified: Secondary | ICD-10-CM | POA: Diagnosis not present

## 2019-10-31 DIAGNOSIS — Z23 Encounter for immunization: Secondary | ICD-10-CM | POA: Insufficient documentation

## 2019-10-31 DIAGNOSIS — S0101XA Laceration without foreign body of scalp, initial encounter: Secondary | ICD-10-CM | POA: Diagnosis not present

## 2019-10-31 DIAGNOSIS — R9082 White matter disease, unspecified: Secondary | ICD-10-CM | POA: Diagnosis not present

## 2019-10-31 DIAGNOSIS — R5381 Other malaise: Secondary | ICD-10-CM | POA: Diagnosis not present

## 2019-10-31 DIAGNOSIS — M6281 Muscle weakness (generalized): Secondary | ICD-10-CM | POA: Diagnosis not present

## 2019-10-31 DIAGNOSIS — R296 Repeated falls: Secondary | ICD-10-CM | POA: Diagnosis not present

## 2019-10-31 DIAGNOSIS — R2681 Unsteadiness on feet: Secondary | ICD-10-CM | POA: Diagnosis not present

## 2019-10-31 DIAGNOSIS — G319 Degenerative disease of nervous system, unspecified: Secondary | ICD-10-CM | POA: Diagnosis not present

## 2019-10-31 DIAGNOSIS — Y92129 Unspecified place in nursing home as the place of occurrence of the external cause: Secondary | ICD-10-CM

## 2019-10-31 DIAGNOSIS — S0003XA Contusion of scalp, initial encounter: Secondary | ICD-10-CM

## 2019-10-31 DIAGNOSIS — S0990XA Unspecified injury of head, initial encounter: Secondary | ICD-10-CM | POA: Diagnosis not present

## 2019-10-31 DIAGNOSIS — Y999 Unspecified external cause status: Secondary | ICD-10-CM | POA: Diagnosis not present

## 2019-10-31 DIAGNOSIS — R278 Other lack of coordination: Secondary | ICD-10-CM | POA: Diagnosis not present

## 2019-10-31 DIAGNOSIS — F0390 Unspecified dementia without behavioral disturbance: Secondary | ICD-10-CM | POA: Diagnosis not present

## 2019-10-31 DIAGNOSIS — Z20828 Contact with and (suspected) exposure to other viral communicable diseases: Secondary | ICD-10-CM | POA: Diagnosis not present

## 2019-10-31 MED ORDER — TETANUS-DIPHTH-ACELL PERTUSSIS 5-2.5-18.5 LF-MCG/0.5 IM SUSP
0.5000 mL | Freq: Once | INTRAMUSCULAR | Status: AC
  Administered 2019-11-01: 0.5 mL via INTRAMUSCULAR
  Filled 2019-10-31: qty 0.5

## 2019-10-31 MED ORDER — LIDOCAINE-EPINEPHRINE (PF) 2 %-1:200000 IJ SOLN
10.0000 mL | Freq: Once | INTRAMUSCULAR | Status: AC
  Administered 2019-11-01: 10 mL via INTRADERMAL
  Filled 2019-10-31: qty 20

## 2019-10-31 MED ORDER — LIDOCAINE-EPINEPHRINE (PF) 1 %-1:200000 IJ SOLN
10.0000 mL | Freq: Once | INTRAMUSCULAR | Status: DC
  Filled 2019-10-31: qty 10

## 2019-10-31 NOTE — ED Triage Notes (Signed)
Pt is from [facility] and had an unwitnessed fall in his neighbors room, he has a laceration to his head, no blood thinners, pt also complains of his hand and knee hurting but that was from a previous fall

## 2019-11-01 DIAGNOSIS — I10 Essential (primary) hypertension: Secondary | ICD-10-CM | POA: Diagnosis not present

## 2019-11-01 DIAGNOSIS — R278 Other lack of coordination: Secondary | ICD-10-CM | POA: Diagnosis not present

## 2019-11-01 DIAGNOSIS — M6281 Muscle weakness (generalized): Secondary | ICD-10-CM | POA: Diagnosis not present

## 2019-11-01 DIAGNOSIS — Z743 Need for continuous supervision: Secondary | ICD-10-CM | POA: Diagnosis not present

## 2019-11-01 DIAGNOSIS — R296 Repeated falls: Secondary | ICD-10-CM | POA: Diagnosis not present

## 2019-11-01 DIAGNOSIS — R531 Weakness: Secondary | ICD-10-CM | POA: Diagnosis not present

## 2019-11-01 DIAGNOSIS — R279 Unspecified lack of coordination: Secondary | ICD-10-CM | POA: Diagnosis not present

## 2019-11-01 DIAGNOSIS — R4182 Altered mental status, unspecified: Secondary | ICD-10-CM | POA: Diagnosis not present

## 2019-11-01 NOTE — ED Provider Notes (Signed)
[facility]-EMERGENCY DEPT Provider Note: Lowella Dell, MD, FACEP  CSN: [id number] MRN: [id number] ARRIVAL: 10/31/19 at 2302 ROOM: WA21/WA21   CHIEF COMPLAINT  Fall  Level 5 caveat: Dementia HISTORY OF PRESENT ILLNESS  10/31/19 11:22 PM William Cummings is a 79 y.o. male who was found on the floor of a neighbors room at his nursing home.  It was presumed that he fell.  There was no known loss of consciousness.  He is not on anticoagulation.  He has a laceration to his scalp.  He is also complaining of pain at the laceration scalp which she rated as a 4 out of 10.  Bleeding is controlled.  His sister is with him and states he is at his baseline mental status.   Past Medical History:  Diagnosis Date  . Dementia ([facility])   . Depression   . Parkinson's disease ([facility])     History reviewed. No pertinent surgical history.  Family History  Problem Relation Age of Onset  . Heart attack Father   . Cancer Sister   . Early death Sister   . Depression Brother     Social History   Tobacco Use  . Smoking status: Never Smoker  . Smokeless tobacco: Never Used  Vaping Use  . Vaping Use: Never used  Substance Use Topics  . Alcohol use: Not Currently    Alcohol/week: 0.0 standard drinks    Comment: glass of wine once every couple months  . Drug use: No    Prior to Admission medications   Medication Sig Start Date End Date Taking? Authorizing Provider  carbidopa-levodopa (SINEMET CR) 50-200 MG tablet Take 1 tablet by mouth at bedtime. 05/08/19  Yes Tat, Octaviano Batty, DO  carbidopa-levodopa (SINEMET IR) 25-100 MG tablet TAKE 2 TABLETS IN THE MORNING, 1 TABLET IN THE AFTERNOON AND 2 TABLETS IN THE EVENING Patient taking differently: See admin instructions. TAKE 2 TABLETS IN THE MORNING, 1 TABLET IN THE AFTERNOON AND 2 TABLETS IN THE EVENING 05/07/19  Yes Tat, Octaviano Batty, DO  sertraline (ZOLOFT) 100 MG tablet TAKE 2 TABLETS BY MOUTH EVERY DAY Patient taking differently: Take 200 mg by mouth daily.   11/02/18  Yes Deeann Saint, MD    Allergies Patient has no known allergies.   REVIEW OF SYSTEMS     PHYSICAL EXAMINATION  Initial Vital Signs Blood pressure (!) 182/83, pulse 61, temperature (!) 97.5 F (36.4 C), temperature source Oral, resp. rate 18, SpO2 100 %.  Examination General: Well-developed, well-nourished male in no acute distress; appearance consistent with age of record HENT: normocephalic; no hemotympanum; right parietal scalp laceration with contused edge of flap:    Eyes: pupils equal, round and reactive to light; extraocular muscles intact; arcus senilis bilaterally Neck: supple; nontender; no pain on passive range of motion Heart: regular rate and rhythm Lungs: clear to auscultation bilaterally Abdomen: soft; nondistended; nontender; bowel sounds present Extremities: Arthritic changes; no tenderness on passive range of motion; pulses normal Neurologic: Awake, alert; motor function intact in all extremities and symmetric; no facial droop Skin: Warm and dry Psychiatric: Flat affect   RESULTS  Summary of this visit's results, reviewed and interpreted by myself:   EKG Interpretation  Date/Time:    Ventricular Rate:    PR Interval:    QRS Duration:   QT Interval:    QTC Calculation:   R Axis:     Text Interpretation:        Laboratory Studies: No results found for this or  any previous visit (from the past 24 hour(s)). Imaging Studies: CT Head Wo Contrast  Result Date: 11/01/2019 CLINICAL DATA:  Fall EXAM: CT HEAD WITHOUT CONTRAST TECHNIQUE: Contiguous axial images were obtained from the base of the skull through the vertex without intravenous contrast. COMPARISON:  10/15/2019 FINDINGS: Brain: There is no mass, hemorrhage or extra-axial collection. There is generalized atrophy without lobar predilection. Hypodensity of the white matter is most commonly associated with chronic microvascular disease. Vascular: No abnormal hyperdensity of the major  intracranial arteries or dural venous sinuses. No intracranial atherosclerosis. Skull: The visualized skull base, calvarium and extracranial soft tissues are normal. Sinuses/Orbits: No fluid levels or advanced mucosal thickening of the visualized paranasal sinuses. No mastoid or middle ear effusion. The orbits are normal. IMPRESSION: Generalized atrophy and chronic microvascular ischemia without acute intracranial abnormality. Electronically Signed   By: Deatra Robinson M.D.   On: 11/01/2019 00:20    ED COURSE and MDM  Nursing notes, initial and subsequent vitals signs, including pulse oximetry, reviewed and interpreted by myself.  Vitals:   10/31/19 2317  BP: (!) 182/83  Pulse: 61  Resp: 18  Temp: (!) 97.5 F (36.4 C)  TempSrc: Oral  SpO2: 100%   Medications  lidocaine-EPINEPHrine (XYLOCAINE W/EPI) 2 %-1:200000 (PF) injection 10 mL (has no administration in time range)  Tdap (BOOSTRIX) injection 0.5 mL (0.5 mLs Intramuscular Given 11/01/19 0019)      PROCEDURES  Procedures  LACERATION REPAIR Performed by: Carlisle Beers Mischell Branford Authorized by: Carlisle Beers Jeniel Slauson Consent: Verbal consent obtained. Risks and benefits: risks, benefits and alternatives were discussed Consent given by: patient Patient identity confirmed: provided demographic data Prepped and Draped in normal sterile fashion Wound explored  Laceration Location: Right parietal scalp  Laceration Length: 5.5 cm  No Foreign Bodies seen or palpated  Anesthesia: local infiltration  Local anesthetic: lidocaine 2% with epinephrine  Anesthetic total: 5 ml  Irrigation method: syringe Amount of cleaning: standard  Skin closure: 4-0 Prolene  Number of sutures: 7  Technique: Simple interrupted  Patient tolerance: Patient tolerated the procedure well with no immediate complications.    ED DIAGNOSES     ICD-10-CM   1. Fall at nursing home, initial encounter  W19.XXXA    Y92.129   2. Laceration of scalp, initial encounter   S01.01XA   3. Contusion of scalp, initial encounter  S00.Marchia Bond, MD 11/01/19 [phone]

## 2019-11-02 ENCOUNTER — Telehealth: Payer: Self-pay | Admitting: Neurology

## 2019-11-02 ENCOUNTER — Telehealth: Payer: Self-pay | Admitting: Family Medicine

## 2019-11-02 DIAGNOSIS — R2681 Unsteadiness on feet: Secondary | ICD-10-CM | POA: Diagnosis not present

## 2019-11-02 DIAGNOSIS — M6281 Muscle weakness (generalized): Secondary | ICD-10-CM | POA: Diagnosis not present

## 2019-11-02 NOTE — Telephone Encounter (Signed)
Make sure that son on release (sister has always brought him as I believe [place]).  Yes, he would likely need memory care and I agree with moving to higher level of care facility

## 2019-11-02 NOTE — Telephone Encounter (Signed)
pts son state that the pt has been falling a lot at the facility he is at and he is unable to stay there any longer.  Pts son would like to have a call back to discuss before they move the pt to a higher skill facility.

## 2019-11-02 NOTE — Telephone Encounter (Signed)
Patient's son reports that the patient keeps falling and will need to be moved from assisted living to a skilled care facility. He would like to get Dr. Don Perking opinion on moving him and if he needs to be in a memory care facility. Please call.

## 2019-11-02 NOTE — Telephone Encounter (Signed)
Message sent to PCP for review

## 2019-11-02 NOTE — Telephone Encounter (Signed)
No answer, no VM

## 2019-11-05 DIAGNOSIS — M6281 Muscle weakness (generalized): Secondary | ICD-10-CM | POA: Diagnosis not present

## 2019-11-05 DIAGNOSIS — R278 Other lack of coordination: Secondary | ICD-10-CM | POA: Diagnosis not present

## 2019-11-05 DIAGNOSIS — R296 Repeated falls: Secondary | ICD-10-CM | POA: Diagnosis not present

## 2019-11-05 NOTE — Telephone Encounter (Signed)
Defer to PCP

## 2019-11-05 NOTE — Telephone Encounter (Signed)
Noted! Pt has been scheduled 

## 2019-11-05 NOTE — Telephone Encounter (Signed)
Unclear if patient son is on release form.  Patient son has been out of the country times several years.  Patient always comes to appointments with his sister.  Agree that patient needs higher level of care.

## 2019-11-05 NOTE — Telephone Encounter (Signed)
Spoke with pts son and inst that PCP can fill out the [facility] form when a SNF is chosen, he verbalized understanding.

## 2019-11-05 NOTE — Telephone Encounter (Signed)
Spoke with pts son who states pt needs to transition from assisted living to skilled care. He has a video visit with PCP 11/07/2019. He states pt is not a flight risk, very immobile, thinks he can get placement sooner in a non-memory care SNF if Dr TAT agrees this is appropriate. Questioning whether they can get an FL2 form filled out to qualify pt for a [facility] covered bed in SNF.Will ask Dr Tat if she fills these out or if she will defer to PCP. Will call son back with updated info, he verbalized understanding.

## 2019-11-06 DIAGNOSIS — R2681 Unsteadiness on feet: Secondary | ICD-10-CM | POA: Diagnosis not present

## 2019-11-06 DIAGNOSIS — M6281 Muscle weakness (generalized): Secondary | ICD-10-CM | POA: Diagnosis not present

## 2019-11-07 ENCOUNTER — Telehealth: Payer: Medicare HMO | Admitting: Family Medicine

## 2019-11-07 DIAGNOSIS — R278 Other lack of coordination: Secondary | ICD-10-CM | POA: Diagnosis not present

## 2019-11-07 DIAGNOSIS — Z1159 Encounter for screening for other viral diseases: Secondary | ICD-10-CM | POA: Diagnosis not present

## 2019-11-07 DIAGNOSIS — Z20828 Contact with and (suspected) exposure to other viral communicable diseases: Secondary | ICD-10-CM | POA: Diagnosis not present

## 2019-11-07 DIAGNOSIS — M6281 Muscle weakness (generalized): Secondary | ICD-10-CM | POA: Diagnosis not present

## 2019-11-07 DIAGNOSIS — R296 Repeated falls: Secondary | ICD-10-CM | POA: Diagnosis not present

## 2019-11-08 ENCOUNTER — Encounter: Payer: Self-pay | Admitting: Family Medicine

## 2019-11-08 ENCOUNTER — Telehealth (INDEPENDENT_AMBULATORY_CARE_PROVIDER_SITE_OTHER): Payer: Medicare HMO | Admitting: Family Medicine

## 2019-11-08 ENCOUNTER — Telehealth: Payer: Self-pay | Admitting: Family Medicine

## 2019-11-08 VITALS — Ht 67.0 in | Wt 140.0 lb

## 2019-11-08 DIAGNOSIS — F339 Major depressive disorder, recurrent, unspecified: Secondary | ICD-10-CM

## 2019-11-08 DIAGNOSIS — M6281 Muscle weakness (generalized): Secondary | ICD-10-CM | POA: Diagnosis not present

## 2019-11-08 DIAGNOSIS — G20 Parkinson's disease: Secondary | ICD-10-CM

## 2019-11-08 DIAGNOSIS — F0280 Dementia in other diseases classified elsewhere without behavioral disturbance: Secondary | ICD-10-CM | POA: Diagnosis not present

## 2019-11-08 DIAGNOSIS — R296 Repeated falls: Secondary | ICD-10-CM | POA: Diagnosis not present

## 2019-11-08 DIAGNOSIS — R2681 Unsteadiness on feet: Secondary | ICD-10-CM | POA: Diagnosis not present

## 2019-11-08 DIAGNOSIS — R278 Other lack of coordination: Secondary | ICD-10-CM | POA: Diagnosis not present

## 2019-11-08 NOTE — Telephone Encounter (Signed)
Virgel Gess dropped off forms to be completed. Informed her of the 3-5 business days and possible fee.   Placed in the red folder.  They would like to be contacted at [phone] for pick up.

## 2019-11-08 NOTE — Progress Notes (Signed)
Virtual visit: [name] Tatsuo Musial and pt's sister Baldwin Racicot present on video visit.     Pt is an 79 yo male with pmh sig for Parkinson's disease, dementia, and depression.   Pt currently resides at [facility] memory care but has been d/c as they feel his no longer safe due to increased falls.  [name] needs a new a FLA-2 form completed for SNF placement.    [name] currently in [place], [place].  Recently returned to the states from [place].  Last saw his father 2 yrs ago.  [name] is pt's POA/HCPOA.  Past Medical History:  Diagnosis Date  . Dementia ([facility])   . Depression   . Parkinson's disease ([facility])      Current Outpatient Medications:  .  carbidopa-levodopa (SINEMET CR) 50-200 MG tablet, Take 1 tablet by mouth at bedtime., Disp: 90 tablet, Rfl: 1 .  carbidopa-levodopa (SINEMET IR) 25-100 MG tablet, TAKE 2 TABLETS IN THE MORNING, 1 TABLET IN THE AFTERNOON AND 2 TABLETS IN THE EVENING (Patient taking differently: See admin instructions. TAKE 2 TABLETS IN THE MORNING, 1 TABLET IN THE AFTERNOON AND 2 TABLETS IN THE EVENING), Disp: 450 tablet, Rfl: 0 .  sertraline (ZOLOFT) 100 MG tablet, TAKE 2 TABLETS BY MOUTH EVERY DAY (Patient taking differently: Take 200 mg by mouth daily. ), Disp: 180 tablet, Rfl: 1  Patient is an 79 year old male with past medical history significant for Parkinson's disease, dementia, history of depression with progression of Parkinson's disease causing increased falls.  Patient now requiring higher level SNF.  -Patient's family given resource information. -Family advised to fax new FL-2 forms to the office for completion for SNF placement. -Reviewed patient's current medications including Sinemet IR 25-100 mg take 2 tablets in the morning, 1 tablet in afternoon, and 2 tablets in the evening.,  Sinemet CR 50-200 mg nightly, and Zoloft 200 mg -Discussed likely disease course -Continue follow-up with neurology, Dr. Arbutus Leas -Consider hospice when  appropriate.   I discussed the assessment and treatment plan with the patient's family. The family was provided an opportunity to ask questions and all were answered. The patient's family agreed with the plan and demonstrated an understanding of the instructions.   The family was advised to call back or seek an in-person evaluation if the symptoms worsen or if the condition fails to improve as anticipated.  I provided 30 minutes of non-face-to-face time during this encounter.   Deeann Saint, MD

## 2019-11-09 DIAGNOSIS — R2681 Unsteadiness on feet: Secondary | ICD-10-CM | POA: Diagnosis not present

## 2019-11-09 DIAGNOSIS — M6281 Muscle weakness (generalized): Secondary | ICD-10-CM | POA: Diagnosis not present

## 2019-11-12 DIAGNOSIS — R278 Other lack of coordination: Secondary | ICD-10-CM | POA: Diagnosis not present

## 2019-11-12 DIAGNOSIS — M6281 Muscle weakness (generalized): Secondary | ICD-10-CM | POA: Diagnosis not present

## 2019-11-12 DIAGNOSIS — R2681 Unsteadiness on feet: Secondary | ICD-10-CM | POA: Diagnosis not present

## 2019-11-12 DIAGNOSIS — R296 Repeated falls: Secondary | ICD-10-CM | POA: Diagnosis not present

## 2019-11-12 NOTE — Telephone Encounter (Signed)
Spoke with pt sister advised that the office received pt forms and Dr Salomon Fick is reviewing the forms, Advised that the office will call soon as the forms are complete

## 2019-11-12 NOTE — Telephone Encounter (Signed)
Pts son William Cummings) is calling inquiring about the form that was dropped off by his Celine Ahr [name]). Son is aware that it can take anywhere from 3-5 business days and that there may be a charge and the aunt asked to be called when ready for pick up.  Son stated that the pt is in a memory care unit now and will not be able to stay there much longer and would like to see if there is a way that the form can be filled out soon due to the circumstance of the pt.

## 2019-11-13 DIAGNOSIS — M6281 Muscle weakness (generalized): Secondary | ICD-10-CM | POA: Diagnosis not present

## 2019-11-13 DIAGNOSIS — R2681 Unsteadiness on feet: Secondary | ICD-10-CM | POA: Diagnosis not present

## 2019-11-13 NOTE — Telephone Encounter (Signed)
FYI Spoke with pt sister state that the family is still looking for a facility to transfer pt to, advised that Dr Salomon Fick will need to know where pt is transferring to to complete Fl2, pt sister state that she will call our office back with the information. Verbalized understanding

## 2019-11-13 NOTE — Telephone Encounter (Signed)
Left a detailed message for pt sister who is listed on pt DPR, requested to call the office with information of  what facility pt is moving to for Dr Salomon Fick to complete pt FL2

## 2019-11-14 DIAGNOSIS — Z20828 Contact with and (suspected) exposure to other viral communicable diseases: Secondary | ICD-10-CM | POA: Diagnosis not present

## 2019-11-14 DIAGNOSIS — Z1159 Encounter for screening for other viral diseases: Secondary | ICD-10-CM | POA: Diagnosis not present

## 2019-11-15 ENCOUNTER — Telehealth: Payer: Self-pay | Admitting: Family Medicine

## 2019-11-15 DIAGNOSIS — R296 Repeated falls: Secondary | ICD-10-CM | POA: Diagnosis not present

## 2019-11-15 DIAGNOSIS — R278 Other lack of coordination: Secondary | ICD-10-CM | POA: Diagnosis not present

## 2019-11-15 DIAGNOSIS — M6281 Muscle weakness (generalized): Secondary | ICD-10-CM | POA: Diagnosis not present

## 2019-11-15 DIAGNOSIS — R2681 Unsteadiness on feet: Secondary | ICD-10-CM | POA: Diagnosis not present

## 2019-11-15 NOTE — Telephone Encounter (Signed)
FYI- pt's son, Arlys John, wants to let you know that you should be receiving a fax and he would like that signed and faxed as soon as possible because he is really concerned about his father. Thanks

## 2019-11-16 NOTE — Telephone Encounter (Signed)
Our office received two forms for pt for Dr Salomon Fick to complete and sign, forms are from different facilities, Dr advised to call [facility] and clarify with them which facility pt is moving to since the Fl2 are from 2 different facilities. Spoke with [facility] with [employer] state that she was not sure and that she will give the message to management and someone should call our office with clarification

## 2019-11-19 ENCOUNTER — Telehealth: Payer: Self-pay | Admitting: Family Medicine

## 2019-11-19 DIAGNOSIS — R2681 Unsteadiness on feet: Secondary | ICD-10-CM | POA: Diagnosis not present

## 2019-11-19 DIAGNOSIS — M6281 Muscle weakness (generalized): Secondary | ICD-10-CM | POA: Diagnosis not present

## 2019-11-19 NOTE — Telephone Encounter (Signed)
Charity from [facility] called and said they are still awaiting the [facility] form.   Fax it to [phone]  Please advise

## 2019-11-19 NOTE — Telephone Encounter (Signed)
Spoke with pt sister who in on pt DPR states that she was on her way to see pt and that she will call our office with an update regarding which nursing Home pt is transferring to

## 2019-11-20 DIAGNOSIS — R278 Other lack of coordination: Secondary | ICD-10-CM | POA: Diagnosis not present

## 2019-11-20 DIAGNOSIS — M6281 Muscle weakness (generalized): Secondary | ICD-10-CM | POA: Diagnosis not present

## 2019-11-20 DIAGNOSIS — R296 Repeated falls: Secondary | ICD-10-CM | POA: Diagnosis not present

## 2019-11-21 DIAGNOSIS — Z20828 Contact with and (suspected) exposure to other viral communicable diseases: Secondary | ICD-10-CM | POA: Diagnosis not present

## 2019-11-21 DIAGNOSIS — R2681 Unsteadiness on feet: Secondary | ICD-10-CM | POA: Diagnosis not present

## 2019-11-21 DIAGNOSIS — M6281 Muscle weakness (generalized): Secondary | ICD-10-CM | POA: Diagnosis not present

## 2019-11-21 DIAGNOSIS — Z1159 Encounter for screening for other viral diseases: Secondary | ICD-10-CM | POA: Diagnosis not present

## 2019-11-21 DIAGNOSIS — R278 Other lack of coordination: Secondary | ICD-10-CM | POA: Diagnosis not present

## 2019-11-21 DIAGNOSIS — R296 Repeated falls: Secondary | ICD-10-CM | POA: Diagnosis not present

## 2019-11-21 NOTE — Telephone Encounter (Signed)
Pt sister called state that they lost the bed that was on hold for pt, state that they will not need the FL2 for [facility]. State that they will need a Blank Fl2 mailed to her address so they can have [facility] complete when they find a facility for pt to transfer to. The FL2 was mailed to pt sister address as requested.

## 2019-11-21 NOTE — Telephone Encounter (Signed)
Pt sister called state that they lost the bed that was on hold for pt, state that they will not need the FL2 for [facility]. State that they will need a Blank Fl2 mailed to her address so they can have [facility] complete when they find a facility for pt to transfer to. The FL2 was mailed to pt sister address as requested. 

## 2019-11-21 NOTE — Telephone Encounter (Signed)
Pt FL2 was faxed to [facility], Left a  detailed message for pt sister to pick up pt form from the office

## 2019-11-22 DIAGNOSIS — R296 Repeated falls: Secondary | ICD-10-CM | POA: Diagnosis not present

## 2019-11-22 DIAGNOSIS — R278 Other lack of coordination: Secondary | ICD-10-CM | POA: Diagnosis not present

## 2019-11-22 DIAGNOSIS — M6281 Muscle weakness (generalized): Secondary | ICD-10-CM | POA: Diagnosis not present

## 2019-11-23 DIAGNOSIS — R2681 Unsteadiness on feet: Secondary | ICD-10-CM | POA: Diagnosis not present

## 2019-11-23 DIAGNOSIS — M6281 Muscle weakness (generalized): Secondary | ICD-10-CM | POA: Diagnosis not present

## 2019-11-26 DIAGNOSIS — R278 Other lack of coordination: Secondary | ICD-10-CM | POA: Diagnosis not present

## 2019-11-26 DIAGNOSIS — M6281 Muscle weakness (generalized): Secondary | ICD-10-CM | POA: Diagnosis not present

## 2019-11-26 DIAGNOSIS — R296 Repeated falls: Secondary | ICD-10-CM | POA: Diagnosis not present

## 2019-11-26 DIAGNOSIS — R2681 Unsteadiness on feet: Secondary | ICD-10-CM | POA: Diagnosis not present

## 2019-11-28 DIAGNOSIS — Z20828 Contact with and (suspected) exposure to other viral communicable diseases: Secondary | ICD-10-CM | POA: Diagnosis not present

## 2019-11-28 DIAGNOSIS — R296 Repeated falls: Secondary | ICD-10-CM | POA: Diagnosis not present

## 2019-11-28 DIAGNOSIS — Z1159 Encounter for screening for other viral diseases: Secondary | ICD-10-CM | POA: Diagnosis not present

## 2019-11-28 DIAGNOSIS — R2681 Unsteadiness on feet: Secondary | ICD-10-CM | POA: Diagnosis not present

## 2019-11-28 DIAGNOSIS — M6281 Muscle weakness (generalized): Secondary | ICD-10-CM | POA: Diagnosis not present

## 2019-11-28 DIAGNOSIS — R278 Other lack of coordination: Secondary | ICD-10-CM | POA: Diagnosis not present

## 2019-11-29 DIAGNOSIS — R2681 Unsteadiness on feet: Secondary | ICD-10-CM | POA: Diagnosis not present

## 2019-11-29 DIAGNOSIS — M6281 Muscle weakness (generalized): Secondary | ICD-10-CM | POA: Diagnosis not present

## 2019-11-30 DIAGNOSIS — M6281 Muscle weakness (generalized): Secondary | ICD-10-CM | POA: Diagnosis not present

## 2019-11-30 DIAGNOSIS — R278 Other lack of coordination: Secondary | ICD-10-CM | POA: Diagnosis not present

## 2019-11-30 DIAGNOSIS — R296 Repeated falls: Secondary | ICD-10-CM | POA: Diagnosis not present

## 2019-12-03 DIAGNOSIS — M6281 Muscle weakness (generalized): Secondary | ICD-10-CM | POA: Diagnosis not present

## 2019-12-03 DIAGNOSIS — R296 Repeated falls: Secondary | ICD-10-CM | POA: Diagnosis not present

## 2019-12-03 DIAGNOSIS — R278 Other lack of coordination: Secondary | ICD-10-CM | POA: Diagnosis not present

## 2019-12-03 DIAGNOSIS — R2681 Unsteadiness on feet: Secondary | ICD-10-CM | POA: Diagnosis not present

## 2019-12-05 DIAGNOSIS — R296 Repeated falls: Secondary | ICD-10-CM | POA: Diagnosis not present

## 2019-12-05 DIAGNOSIS — M6281 Muscle weakness (generalized): Secondary | ICD-10-CM | POA: Diagnosis not present

## 2019-12-05 DIAGNOSIS — R278 Other lack of coordination: Secondary | ICD-10-CM | POA: Diagnosis not present

## 2019-12-05 DIAGNOSIS — Z20828 Contact with and (suspected) exposure to other viral communicable diseases: Secondary | ICD-10-CM | POA: Diagnosis not present

## 2019-12-05 DIAGNOSIS — Z1159 Encounter for screening for other viral diseases: Secondary | ICD-10-CM | POA: Diagnosis not present

## 2019-12-05 DIAGNOSIS — R2681 Unsteadiness on feet: Secondary | ICD-10-CM | POA: Diagnosis not present

## 2019-12-07 DIAGNOSIS — M6281 Muscle weakness (generalized): Secondary | ICD-10-CM | POA: Diagnosis not present

## 2019-12-07 DIAGNOSIS — R2681 Unsteadiness on feet: Secondary | ICD-10-CM | POA: Diagnosis not present

## 2019-12-11 DIAGNOSIS — R278 Other lack of coordination: Secondary | ICD-10-CM | POA: Diagnosis not present

## 2019-12-11 DIAGNOSIS — R2681 Unsteadiness on feet: Secondary | ICD-10-CM | POA: Diagnosis not present

## 2019-12-11 DIAGNOSIS — M6281 Muscle weakness (generalized): Secondary | ICD-10-CM | POA: Diagnosis not present

## 2019-12-11 DIAGNOSIS — R296 Repeated falls: Secondary | ICD-10-CM | POA: Diagnosis not present

## 2019-12-12 DIAGNOSIS — Z20828 Contact with and (suspected) exposure to other viral communicable diseases: Secondary | ICD-10-CM | POA: Diagnosis not present

## 2019-12-12 DIAGNOSIS — Z1159 Encounter for screening for other viral diseases: Secondary | ICD-10-CM | POA: Diagnosis not present

## 2019-12-13 DIAGNOSIS — R2681 Unsteadiness on feet: Secondary | ICD-10-CM | POA: Diagnosis not present

## 2019-12-13 DIAGNOSIS — M6281 Muscle weakness (generalized): Secondary | ICD-10-CM | POA: Diagnosis not present

## 2019-12-13 DIAGNOSIS — R278 Other lack of coordination: Secondary | ICD-10-CM | POA: Diagnosis not present

## 2019-12-13 DIAGNOSIS — R296 Repeated falls: Secondary | ICD-10-CM | POA: Diagnosis not present

## 2019-12-14 DIAGNOSIS — R296 Repeated falls: Secondary | ICD-10-CM | POA: Diagnosis not present

## 2019-12-14 DIAGNOSIS — R278 Other lack of coordination: Secondary | ICD-10-CM | POA: Diagnosis not present

## 2019-12-14 DIAGNOSIS — R2681 Unsteadiness on feet: Secondary | ICD-10-CM | POA: Diagnosis not present

## 2019-12-14 DIAGNOSIS — M6281 Muscle weakness (generalized): Secondary | ICD-10-CM | POA: Diagnosis not present

## 2019-12-17 ENCOUNTER — Emergency Department (HOSPITAL_COMMUNITY)
Admission: EM | Admit: 2019-12-17 | Discharge: 2019-12-18 | Disposition: A | Discharge status: Home / Self Care | Payer: Medicare HMO | Attending: Emergency Medicine | Admitting: Emergency Medicine

## 2019-12-17 ENCOUNTER — Emergency Department (HOSPITAL_COMMUNITY): Payer: Medicare HMO

## 2019-12-17 ENCOUNTER — Other Ambulatory Visit: Payer: Self-pay

## 2019-12-17 ENCOUNTER — Encounter (HOSPITAL_COMMUNITY): Payer: Self-pay

## 2019-12-17 DIAGNOSIS — Y929 Unspecified place or not applicable: Secondary | ICD-10-CM | POA: Insufficient documentation

## 2019-12-17 DIAGNOSIS — S0990XA Unspecified injury of head, initial encounter: Secondary | ICD-10-CM

## 2019-12-17 DIAGNOSIS — S0101XD Laceration without foreign body of scalp, subsequent encounter: Secondary | ICD-10-CM | POA: Diagnosis not present

## 2019-12-17 DIAGNOSIS — G20 Parkinson's disease: Secondary | ICD-10-CM | POA: Diagnosis not present

## 2019-12-17 DIAGNOSIS — W19XXXA Unspecified fall, initial encounter: Secondary | ICD-10-CM | POA: Insufficient documentation

## 2019-12-17 DIAGNOSIS — F0390 Unspecified dementia without behavioral disturbance: Secondary | ICD-10-CM | POA: Diagnosis not present

## 2019-12-17 DIAGNOSIS — S0083XA Contusion of other part of head, initial encounter: Secondary | ICD-10-CM | POA: Insufficient documentation

## 2019-12-17 DIAGNOSIS — Y939 Activity, unspecified: Secondary | ICD-10-CM | POA: Insufficient documentation

## 2019-12-17 DIAGNOSIS — I10 Essential (primary) hypertension: Secondary | ICD-10-CM | POA: Diagnosis not present

## 2019-12-17 DIAGNOSIS — Y999 Unspecified external cause status: Secondary | ICD-10-CM | POA: Diagnosis not present

## 2019-12-17 DIAGNOSIS — M7989 Other specified soft tissue disorders: Secondary | ICD-10-CM | POA: Diagnosis not present

## 2019-12-17 DIAGNOSIS — Z4802 Encounter for removal of sutures: Secondary | ICD-10-CM

## 2019-12-17 DIAGNOSIS — R220 Localized swelling, mass and lump, head: Secondary | ICD-10-CM | POA: Diagnosis not present

## 2019-12-17 DIAGNOSIS — R5381 Other malaise: Secondary | ICD-10-CM | POA: Diagnosis not present

## 2019-12-17 DIAGNOSIS — S199XXA Unspecified injury of neck, initial encounter: Secondary | ICD-10-CM | POA: Diagnosis not present

## 2019-12-17 MED ORDER — ACETAMINOPHEN 325 MG PO TABS
650.0000 mg | ORAL_TABLET | Freq: Once | ORAL | Status: AC
  Administered 2019-12-17: 650 mg via ORAL
  Filled 2019-12-17: qty 2

## 2019-12-17 MED ORDER — CARBIDOPA-LEVODOPA ER 50-200 MG PO TBCR
1.0000 | EXTENDED_RELEASE_TABLET | Freq: Every day | ORAL | Status: DC
  Administered 2019-12-18: 1 via ORAL
  Filled 2019-12-17 (×2): qty 1

## 2019-12-17 MED ORDER — SERTRALINE HCL 50 MG PO TABS
200.0000 mg | ORAL_TABLET | Freq: Every day | ORAL | Status: DC
  Administered 2019-12-18: 200 mg via ORAL
  Filled 2019-12-17 (×2): qty 4

## 2019-12-17 MED ORDER — LISINOPRIL 10 MG PO TABS
10.0000 mg | ORAL_TABLET | Freq: Once | ORAL | Status: AC
  Administered 2019-12-17: 10 mg via ORAL
  Filled 2019-12-17: qty 1

## 2019-12-17 NOTE — Discharge Instructions (Signed)
Your CT head and neck and x-rays were unremarkable at this point.  I have removed some sutures.  Your blood pressure is elevated please recheck at the facility in a week.  See your doctor for follow-up.  Return to ER if you have worse headaches, another fall, vomiting, weakness

## 2019-12-17 NOTE — Progress Notes (Signed)
CSW has put in calls to [facility] front desk [phone]), the med tech at the pt's Memory Care Unit [phone]) and to [facility], supervisor at [facility] at ph: [phone] and have still not received a call back.  CSW will continue to follow for D/C needs.  Dorothe Pea. Alane Hanssen  MSW, LCSW, LCAS, CCS Transitions of Care Clinical Social Worker Care Coordination Department Ph: [phone]

## 2019-12-17 NOTE — Progress Notes (Signed)
Consult request has been received. CSW attempting to follow up at present time  CSW will continue to follow for D/C needs.  Domenique Quest F. Jahzara Slattery  MSW, LCSW, LCAS, CSI Transitions of Care Clinical Social Worker Care Coordination Department Ph: [phone]      

## 2019-12-17 NOTE — ED Triage Notes (Signed)
Per EMS, Pt is coming from [facility]. PT had an unwitnessed fall when the staff turned their back. Pt presents with new hematoma on his forehead. No other injuries noted. HX of dementia, and previous falls. No blood thinners used.

## 2019-12-17 NOTE — Progress Notes (Signed)
CSW called pt's son who states he has a family friend who can sit with the pt tomorrow at [facility] after D/C and after the pt returns to the facility and for the forseeable future.  Per pt's son Odes Lolli at ph: [phone] is in agreement with the plan for the pt to go back to the Memory Care to seek placement into a SNF for LTC as the CSW explained the barriers to LTC placement from the E.D.  [name] states that he was told by the facility that the facility would take over placement from [facility] DSS and that the pt's son was assured by Barrett Henle that [facility] could facilitate placement much faster and that they would, "take care of it".  Pt states he was then told the facility said the facility would "just send the pt to the hospital, "to be admitted and then placed into a skilled nursing facility for LTC".  CSW explained pt was not admitted and explained such barriers as the pt needing a level II passr from the hospital and the lack of a safe D/C plan by the facility for SNF placement for rehab.  [name] voiced understanding and was educated about going to Medicare.gov and seeking appropriate facilities and requesting LTC using Medicaid for LTC.   [name] stated he was paying privately for pt's care at [facility] and that he has already begun the application for Medicaid with [place]'s DSS for the pt and would be glad to work with the facility with placement from the facility.  CSW stated the plan for 8/24 would be to likely encourage the facility to do their duty and safely house their client (the pt) while seeing an appropriate and safe D/C plan.  2nd shift ED CSW will leave handoff for 1st shift ED CSW.  CSW will continue to follow for D/C needs.  Dorothe Pea. Emanuel Campos  MSW, LCSW, LCAS, CCS Transitions of Care Clinical Social Worker Care Coordination Department Ph: [phone]

## 2019-12-17 NOTE — ED Provider Notes (Addendum)
[facility]-EMERGENCY DEPT Provider Note   CSN: [id number] Arrival date & time: 12/17/19  1856     History Chief Complaint  Patient presents with  . Fall    William Cummings is a 79 y.o. male history dementia, depression here presenting with unwitnessed fall.  Patient is from a nursing home.  Patient was found to have a new hematoma in the back of his head today.  Patient cannot tell me how he fell.  Patient had previous fall and is not currently on blood thinners.  Patient has no complaints currently.   The history is provided by the patient and the EMS personnel.       Past Medical History:  Diagnosis Date  . Dementia (HCC)   . Depression   . Parkinson's disease [facility])     Patient Active Problem List   Diagnosis Date Noted  . Dementia associated with Parkinson's disease (HCC) 10/09/2018  . Parkinson's disease (HCC) 08/03/2017  . Depression, recurrent (HCC) 08/03/2017    History reviewed. No pertinent surgical history.     Family History  Problem Relation Age of Onset  . Heart attack Father   . Cancer Sister   . Early death Sister   . Depression Brother     Social History   Tobacco Use  . Smoking status: Never Smoker  . Smokeless tobacco: Never Used  Vaping Use  . Vaping Use: Never used  Substance Use Topics  . Alcohol use: Not Currently    Alcohol/week: 0.0 standard drinks    Comment: glass of wine once every couple months  . Drug use: No    Home Medications Prior to Admission medications   Medication Sig Start Date End Date Taking? Authorizing Provider  carbidopa-levodopa (SINEMET CR) 50-200 MG tablet Take 1 tablet by mouth at bedtime. 05/08/19   Tat, Octaviano Batty, DO  carbidopa-levodopa (SINEMET IR) 25-100 MG tablet TAKE 2 TABLETS IN THE MORNING, 1 TABLET IN THE AFTERNOON AND 2 TABLETS IN THE EVENING Patient taking differently: See admin instructions. TAKE 2 TABLETS IN THE MORNING, 1 TABLET IN THE AFTERNOON AND 2 TABLETS IN THE  EVENING 05/07/19   Tat, Octaviano Batty, DO  sertraline (ZOLOFT) 100 MG tablet TAKE 2 TABLETS BY MOUTH EVERY DAY Patient taking differently: Take 200 mg by mouth daily.  11/02/18   Deeann Saint, MD    Allergies    Patient has no known allergies.  Review of Systems   Review of Systems  Unable to perform ROS: Dementia  All other systems reviewed and are negative.   Physical Exam Updated Vital Signs SpO2 98%   Physical Exam Vitals and nursing note reviewed.  Constitutional:      Comments: Demented  HENT:     Head:     Comments: Small hematoma in the back of his head.  Patient does have some old stitches in the top of the scalp     Mouth/Throat:     Mouth: Mucous membranes are moist.  Eyes:     Extraocular Movements: Extraocular movements intact.     Pupils: Pupils are equal, round, and reactive to light.  Neck:     Comments: C-collar in place. Cardiovascular:     Rate and Rhythm: Normal rate and regular rhythm.     Pulses: Normal pulses.  Pulmonary:     Effort: Pulmonary effort is normal.     Breath sounds: Normal breath sounds.  Abdominal:     General: Abdomen is flat.  Musculoskeletal:  Comments: Right knee abrasion.  Normal range of motion bilateral hips.  No spinal tenderness.  No other extremity trauma.  Skin:    General: Skin is warm.     Capillary Refill: Capillary refill takes less than 2 seconds.  Neurological:     General: No focal deficit present.     Mental Status: He is alert.     Comments: Demented, moving all extremities.  Psychiatric:        Mood and Affect: Mood normal.     Comments: Calm      ED Results / Procedures / Treatments   Labs (all labs ordered are listed, but only abnormal results are displayed) Labs Reviewed - No data to display  EKG None  Radiology No results found.  Procedures Procedures (including critical care time)  SUTURE REMOVAL Performed by: Richardean Canal  Consent: Verbal consent obtained. Patient identity  confirmed: provided demographic data Time out: Immediately prior to procedure a "time out" was called to verify the correct patient, procedure, equipment, support staff and site/side marked as required.  Location details: scalp   Wound Appearance: clean  Sutures/Staples Removed: 5   Facility: sutures placed in this facility Patient tolerance: Patient tolerated the procedure well with no immediate complications.     Medications Ordered in ED Medications  acetaminophen (TYLENOL) tablet 650 mg (has no administration in time range)    ED Course  I have reviewed the triage vital signs and the nursing notes.  Pertinent labs & imaging results that were available during my care of the patient were reviewed by me and considered in my medical decision making (see chart for details).    MDM Rules/Calculators/A&P                          [name] is a 79 y.o. male here presenting with unwitnessed fall.  Patient is demented at baseline and unable to give any history.  Patient is not currently on blood thinners.  Will get CT head and neck and right knee x-rays.  Patient has some old stitches on his scalp that I will remove.  8:17 PM CT head/neck unremarkable. R knee xray showed no fracture. C collar cleared. Stable for discharge   11:02 PM After discharge, apparently facility did not want to take him back.  Facility did send over a form saying that they notified him for discharge from the facility but form was signed 13TH.  I had social work call the facility per the supervisor did not call back.  At this point, social work will talk to the facility in the morning regarding this patient.  Patient will likely need to still go back in the morning when the supervisor is reached.   Final Clinical Impression(s) / ED Diagnoses Final diagnoses:  None    Rx / DC Orders ED Discharge Orders    None       Charlynne Pander, MD 12/17/19 2017    Charlynne Pander, MD 12/17/19  [phone]
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
## 2019-12-18 DIAGNOSIS — R404 Transient alteration of awareness: Secondary | ICD-10-CM | POA: Diagnosis not present

## 2019-12-18 DIAGNOSIS — Z743 Need for continuous supervision: Secondary | ICD-10-CM | POA: Diagnosis not present

## 2019-12-18 DIAGNOSIS — R270 Ataxia, unspecified: Secondary | ICD-10-CM | POA: Diagnosis not present

## 2019-12-18 DIAGNOSIS — R279 Unspecified lack of coordination: Secondary | ICD-10-CM | POA: Diagnosis not present

## 2019-12-18 DIAGNOSIS — I10 Essential (primary) hypertension: Secondary | ICD-10-CM | POA: Diagnosis not present

## 2019-12-18 MED ORDER — ACETAMINOPHEN 325 MG PO TABS: 650.0000 mg | ORAL_TABLET | ORAL | Status: DC | PRN

## 2019-12-18 MED ORDER — LORAZEPAM 1 MG PO TABS: 1.0000 mg | ORAL_TABLET | ORAL | Status: DC | PRN

## 2019-12-18 NOTE — ED Notes (Signed)
[facility] called and refusing patient Social work notified
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
## 2019-12-18 NOTE — ED Notes (Signed)
[facility] called  
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
## 2019-12-18 NOTE — ED Provider Notes (Signed)
  Physical Exam  BP (!) 172/88   Pulse 70   Temp 98.2 F (36.8 C) (Oral)   Resp 18   SpO2 97%   Physical Exam  ED Course/Procedures     Procedures  MDM  I follow up on social work consult today.  Initially [facility] refused to take him back and then social work contacted social services for APS report. Now they are agreeable to take him back.  Stable for discharge.    Charlynne Pander, MD 12/18/19 [phone]

## 2019-12-18 NOTE — Progress Notes (Signed)
CSW called Debbie at [facility] who stated she had no open beds and was under the impression after taking to the pt's son that the pt would be remaining at the [facility] ED for placement.  CSW noted this and informed her pt would be en route back to the facility and Eunice Blase stated this was good as there were no open beds there at [facility] for at least three days or more.  CSW will continue to follow for D/C needs.  Dorothe Pea. Jilliann Subramanian  MSW, LCSW, LCAS, CCS Transitions of Care Clinical Social Worker Care Coordination Department Ph: [phone]

## 2019-12-19 DIAGNOSIS — M6281 Muscle weakness (generalized): Secondary | ICD-10-CM | POA: Diagnosis not present

## 2019-12-19 DIAGNOSIS — Z20828 Contact with and (suspected) exposure to other viral communicable diseases: Secondary | ICD-10-CM | POA: Diagnosis not present

## 2019-12-19 DIAGNOSIS — R2681 Unsteadiness on feet: Secondary | ICD-10-CM | POA: Diagnosis not present

## 2019-12-19 DIAGNOSIS — Z1159 Encounter for screening for other viral diseases: Secondary | ICD-10-CM | POA: Diagnosis not present

## 2019-12-21 DIAGNOSIS — R2681 Unsteadiness on feet: Secondary | ICD-10-CM | POA: Diagnosis not present

## 2019-12-21 DIAGNOSIS — M6281 Muscle weakness (generalized): Secondary | ICD-10-CM | POA: Diagnosis not present

## 2019-12-25 DIAGNOSIS — M6281 Muscle weakness (generalized): Secondary | ICD-10-CM | POA: Diagnosis not present

## 2019-12-25 DIAGNOSIS — R2681 Unsteadiness on feet: Secondary | ICD-10-CM | POA: Diagnosis not present

## 2019-12-26 DIAGNOSIS — Z1159 Encounter for screening for other viral diseases: Secondary | ICD-10-CM | POA: Diagnosis not present

## 2019-12-26 DIAGNOSIS — Z20828 Contact with and (suspected) exposure to other viral communicable diseases: Secondary | ICD-10-CM | POA: Diagnosis not present

## 2019-12-27 DIAGNOSIS — R2681 Unsteadiness on feet: Secondary | ICD-10-CM | POA: Diagnosis not present

## 2019-12-27 DIAGNOSIS — M6281 Muscle weakness (generalized): Secondary | ICD-10-CM | POA: Diagnosis not present

## 2020-01-01 ENCOUNTER — Telehealth: Payer: Self-pay | Admitting: Family Medicine

## 2020-01-01 ENCOUNTER — Telehealth: Payer: Self-pay | Admitting: Neurology

## 2020-01-01 NOTE — Telephone Encounter (Signed)
Patient son Arlys John calling stating his Dad is at [facility] but now the patient parkinson disease is getting worst and wants to get an order/referral for a skilled nursing faculty to help.

## 2020-01-01 NOTE — Telephone Encounter (Signed)
See 7/9 phone call.  PCP was willing to help fill out FL2 forms (see her 7/15 phone note) but they needed to f/u with her.

## 2020-01-01 NOTE — Telephone Encounter (Signed)
Patient's sister called wanting a recommendation from Dr. Arbutus Leas to get the patient placed into a nursing facility.   She said, "His Parkinson's Disease is getting worse and he is falling more. If he needs an other appointment, please let me know."

## 2020-01-02 DIAGNOSIS — R2681 Unsteadiness on feet: Secondary | ICD-10-CM | POA: Diagnosis not present

## 2020-01-02 DIAGNOSIS — M6281 Muscle weakness (generalized): Secondary | ICD-10-CM | POA: Diagnosis not present

## 2020-01-02 DIAGNOSIS — Z20828 Contact with and (suspected) exposure to other viral communicable diseases: Secondary | ICD-10-CM | POA: Diagnosis not present

## 2020-01-02 DIAGNOSIS — H2513 Age-related nuclear cataract, bilateral: Secondary | ICD-10-CM | POA: Diagnosis not present

## 2020-01-02 DIAGNOSIS — Z1159 Encounter for screening for other viral diseases: Secondary | ICD-10-CM | POA: Diagnosis not present

## 2020-01-02 NOTE — Telephone Encounter (Signed)
Please advise 
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
## 2020-01-03 ENCOUNTER — Telehealth: Payer: Self-pay | Admitting: Neurology

## 2020-01-03 NOTE — Telephone Encounter (Signed)
Spoke with patients sister and informed her of Dr Don Perking recommendations. She voiced understanding.

## 2020-01-03 NOTE — Telephone Encounter (Signed)
Left message for patients sister to contact office.

## 2020-01-03 NOTE — Telephone Encounter (Signed)
Patient's sister is trying to have up to date information ready for the nursing facility. She was asking if they could have an appointment to have Dr Tat assess where patient is at in his progression with Parkinson's and if he would be considered a fall risk. Please advise and call.

## 2020-01-03 NOTE — Telephone Encounter (Signed)
[facility] 01/02/20 @ 4:40pm:  "Caller states she needs to make an appt regarding nursing home placement eval for her brother. Wants an appt after 3pm."

## 2020-01-03 NOTE — Telephone Encounter (Signed)
Has pt been accepted into a SNF?  If not this would be a similar process to before with the FL-2 form.

## 2020-01-03 NOTE — Telephone Encounter (Signed)
See my phone note from yesterday regarding this.  Needs to make appt with PCP to fill out FL2

## 2020-01-04 ENCOUNTER — Other Ambulatory Visit: Payer: Self-pay | Admitting: Family Medicine

## 2020-01-04 NOTE — Telephone Encounter (Signed)
Please advise 

## 2020-01-04 NOTE — Telephone Encounter (Signed)
The patient has a follow up appt in January is that the soonest you we have. I just want to make sure there is nothing I need to do.

## 2020-01-04 NOTE — Telephone Encounter (Signed)
Ok.  Looks like appt has been made.

## 2020-01-07 ENCOUNTER — Encounter (HOSPITAL_COMMUNITY): Payer: Self-pay | Admitting: Emergency Medicine

## 2020-01-07 ENCOUNTER — Emergency Department (HOSPITAL_COMMUNITY)
Admission: EM | Admit: 2020-01-07 | Discharge: 2020-01-08 | Disposition: A | Discharge status: Home / Self Care | Payer: Medicare HMO | Attending: Emergency Medicine | Admitting: Emergency Medicine

## 2020-01-07 DIAGNOSIS — S0003XA Contusion of scalp, initial encounter: Secondary | ICD-10-CM | POA: Diagnosis not present

## 2020-01-07 DIAGNOSIS — Y999 Unspecified external cause status: Secondary | ICD-10-CM | POA: Diagnosis not present

## 2020-01-07 DIAGNOSIS — S0121XA Laceration without foreign body of nose, initial encounter: Secondary | ICD-10-CM | POA: Diagnosis not present

## 2020-01-07 DIAGNOSIS — S0993XA Unspecified injury of face, initial encounter: Secondary | ICD-10-CM | POA: Diagnosis present

## 2020-01-07 DIAGNOSIS — S0240EA Zygomatic fracture, right side, initial encounter for closed fracture: Secondary | ICD-10-CM | POA: Diagnosis not present

## 2020-01-07 DIAGNOSIS — W19XXXA Unspecified fall, initial encounter: Secondary | ICD-10-CM | POA: Diagnosis not present

## 2020-01-07 DIAGNOSIS — S199XXA Unspecified injury of neck, initial encounter: Secondary | ICD-10-CM | POA: Diagnosis not present

## 2020-01-07 DIAGNOSIS — I213 ST elevation (STEMI) myocardial infarction of unspecified site: Secondary | ICD-10-CM | POA: Diagnosis not present

## 2020-01-07 DIAGNOSIS — Y929 Unspecified place or not applicable: Secondary | ICD-10-CM | POA: Diagnosis not present

## 2020-01-07 DIAGNOSIS — Y939 Activity, unspecified: Secondary | ICD-10-CM | POA: Diagnosis not present

## 2020-01-07 DIAGNOSIS — R609 Edema, unspecified: Secondary | ICD-10-CM | POA: Diagnosis not present

## 2020-01-07 DIAGNOSIS — S022XXA Fracture of nasal bones, initial encounter for closed fracture: Secondary | ICD-10-CM | POA: Diagnosis not present

## 2020-01-07 DIAGNOSIS — R0902 Hypoxemia: Secondary | ICD-10-CM | POA: Diagnosis not present

## 2020-01-07 DIAGNOSIS — R030 Elevated blood-pressure reading, without diagnosis of hypertension: Secondary | ICD-10-CM | POA: Diagnosis not present

## 2020-01-07 DIAGNOSIS — I10 Essential (primary) hypertension: Secondary | ICD-10-CM | POA: Diagnosis not present

## 2020-01-07 DIAGNOSIS — S0181XA Laceration without foreign body of other part of head, initial encounter: Secondary | ICD-10-CM

## 2020-01-07 DIAGNOSIS — R58 Hemorrhage, not elsewhere classified: Secondary | ICD-10-CM | POA: Diagnosis not present

## 2020-01-07 NOTE — ED Provider Notes (Signed)
[facility] Provider Note   CSN: [id number] Arrival date & time: 01/07/20  2320   History Chief Complaint  Patient presents with  . Fall    Salomon FickGarland Henry Chelsea AusMcAdoo is a 79 y.o. male.  The history is provided by the EMS personnel. The history is limited by the condition of the patient (Dementia).  Fall  He has history of Parkinson's disease with dementia and was brought in by EMS following an unwitnessed fall at the nursing facility where he lives.  There is a laceration to his forehead and a laceration to the bridge of his nose.  He is unable to tell me how he fell, but he has no other complaints.  Past Medical History:  Diagnosis Date  . Dementia (HCC)   . Depression   . Parkinson's disease [facility](HCC)     Patient Active Problem List   Diagnosis Date Noted  . Dementia associated with Parkinson's disease (HCC) 10/09/2018  . Parkinson's disease (HCC) 08/03/2017  . Depression, recurrent (HCC) 08/03/2017    History reviewed. No pertinent surgical history.     Family History  Problem Relation Age of Onset  . Heart attack Father   . Cancer Sister   . Early death Sister   . Depression Brother     Social History   Tobacco Use  . Smoking status: Never Smoker  . Smokeless tobacco: Never Used  Vaping Use  . Vaping Use: Never used  Substance Use Topics  . Alcohol use: Not Currently    Alcohol/week: 0.0 standard drinks    Comment: glass of wine once every couple months  . Drug use: No    Home Medications Prior to Admission medications   Medication Sig Start Date End Date Taking? Authorizing Provider  carbidopa-levodopa (SINEMET CR) 50-200 MG tablet Take 1 tablet by mouth at bedtime. 05/08/19   Tat, Octaviano Battyebecca S, DO  carbidopa-levodopa (SINEMET IR) 25-100 MG tablet 2 at 8am/1 at noon/2 at 4pm 01/04/20   Tat, Rebecca S, DO  sertraline (ZOLOFT) 100 MG tablet TAKE 2 TABLETS BY MOUTH EVERY DAY Patient taking differently: Take 200 mg by mouth  daily.  11/02/18   Deeann SaintBanks, Shannon R, MD    Allergies    Patient has no known allergies.  Review of Systems   Review of Systems  Unable to perform ROS: Dementia    Physical Exam Updated Vital Signs BP (!) 191/89 (BP Location: Left Arm)   Pulse 65   Temp 97.9 F (36.6 C) (Oral)   Resp 11   Ht 5\' 7"  (1.702 m)   Wt 63.5 kg   SpO2 100%   BMI 21.93 kg/m   Physical Exam Vitals and nursing note reviewed.   79 year old male, resting comfortably and in no acute distress. Vital signs are significant for elevated blood pressure. Oxygen saturation is 100%, which is normal. Head is normocephalic.  Laceration is present on the forehead and on the bridge of the nose.  Nasal deformity is noted. PERRLA, EOMI. Oropharynx is clear. Neck is immobilized in a stiff cervical collar and is  supple without adenopathy or JVD. Back is nontender and there is no CVA tenderness. Lungs are clear without rales, wheezes, or rhonchi. Chest is nontender. Heart has regular rate and rhythm without murmur. Abdomen is soft, flat, nontender without masses or hepatosplenomegaly and peristalsis is normoactive. Extremities have no cyanosis or edema, full range of motion is present. Skin is warm and dry without rash. Neurologic: Awake and alert,  cranial nerves are intact, there are no motor or sensory deficits.  Generalized increased muscle tone noted.   ED Results / Procedures / Treatments    Radiology CT Head Wo Contrast  Result Date: 01/08/2020 CLINICAL DATA:  Fall EXAM: CT HEAD WITHOUT CONTRAST CT MAXILLOFACIAL WITHOUT CONTRAST CT CERVICAL SPINE WITHOUT CONTRAST TECHNIQUE: Multidetector CT imaging of the head, cervical spine, and maxillofacial structures were performed using the standard protocol without intravenous contrast. Multiplanar CT image reconstructions of the cervical spine and maxillofacial structures were also generated. COMPARISON:  None. FINDINGS: CT HEAD FINDINGS Brain: There is no mass, hemorrhage  or extra-axial collection. There is generalized atrophy without lobar predilection. There is hypoattenuation of the periventricular white matter, most commonly indicating chronic ischemic microangiopathy. Vascular: No abnormal hyperdensity of the major intracranial arteries or dural venous sinuses. No intracranial atherosclerosis. Skull: Small right frontal scalp hematoma.  No skull fracture. CT MAXILLOFACIAL FINDINGS Osseous: There are comminuted nasal bone fractures. There is a slightly displaced fracture of the right zygomatic arch. Orbits: The globes are intact. Normal appearance of the intra- and extraconal fat. Symmetric extraocular muscles and optic nerves. Sinuses: No fluid levels or advanced mucosal thickening. Soft tissues: Normal visualized extracranial soft tissues. CT CERVICAL SPINE FINDINGS Alignment: No static subluxation. Facets are aligned. Occipital condyles and the lateral masses of C1-C2 are aligned. Skull base and vertebrae: No acute fracture. Soft tissues and spinal canal: No prevertebral fluid or swelling. No visible canal hematoma. Disc levels: Disc bulges at C3-4 and C4-5 with mild spinal canal narrowing. Multilevel severe facet arthrosis. Upper chest: No pneumothorax, pulmonary nodule or pleural effusion. Other: Normal visualized paraspinal cervical soft tissues. IMPRESSION: 1. No acute intracranial abnormality. 2. Small right frontal scalp hematoma. 3. Comminuted nasal bone fractures and slightly displaced fracture of the right zygomatic arch. 4. No acute fracture or static subluxation of the cervical spine. Electronically Signed   By: Deatra Robinson M.D.   On: 01/08/2020 01:33   CT Cervical Spine Wo Contrast  Result Date: 01/08/2020 CLINICAL DATA:  Fall EXAM: CT HEAD WITHOUT CONTRAST CT MAXILLOFACIAL WITHOUT CONTRAST CT CERVICAL SPINE WITHOUT CONTRAST TECHNIQUE: Multidetector CT imaging of the head, cervical spine, and maxillofacial structures were performed using the standard protocol  without intravenous contrast. Multiplanar CT image reconstructions of the cervical spine and maxillofacial structures were also generated. COMPARISON:  None. FINDINGS: CT HEAD FINDINGS Brain: There is no mass, hemorrhage or extra-axial collection. There is generalized atrophy without lobar predilection. There is hypoattenuation of the periventricular white matter, most commonly indicating chronic ischemic microangiopathy. Vascular: No abnormal hyperdensity of the major intracranial arteries or dural venous sinuses. No intracranial atherosclerosis. Skull: Small right frontal scalp hematoma.  No skull fracture. CT MAXILLOFACIAL FINDINGS Osseous: There are comminuted nasal bone fractures. There is a slightly displaced fracture of the right zygomatic arch. Orbits: The globes are intact. Normal appearance of the intra- and extraconal fat. Symmetric extraocular muscles and optic nerves. Sinuses: No fluid levels or advanced mucosal thickening. Soft tissues: Normal visualized extracranial soft tissues. CT CERVICAL SPINE FINDINGS Alignment: No static subluxation. Facets are aligned. Occipital condyles and the lateral masses of C1-C2 are aligned. Skull base and vertebrae: No acute fracture. Soft tissues and spinal canal: No prevertebral fluid or swelling. No visible canal hematoma. Disc levels: Disc bulges at C3-4 and C4-5 with mild spinal canal narrowing. Multilevel severe facet arthrosis. Upper chest: No pneumothorax, pulmonary nodule or pleural effusion. Other: Normal visualized paraspinal cervical soft tissues. IMPRESSION: 1. No acute intracranial abnormality. 2.  Small right frontal scalp hematoma. 3. Comminuted nasal bone fractures and slightly displaced fracture of the right zygomatic arch. 4. No acute fracture or static subluxation of the cervical spine. Electronically Signed   By: Deatra Robinson M.D.   On: 01/08/2020 01:33   CT Maxillofacial Wo Contrast  Result Date: 01/08/2020 CLINICAL DATA:  Fall EXAM: CT HEAD  WITHOUT CONTRAST CT MAXILLOFACIAL WITHOUT CONTRAST CT CERVICAL SPINE WITHOUT CONTRAST TECHNIQUE: Multidetector CT imaging of the head, cervical spine, and maxillofacial structures were performed using the standard protocol without intravenous contrast. Multiplanar CT image reconstructions of the cervical spine and maxillofacial structures were also generated. COMPARISON:  None. FINDINGS: CT HEAD FINDINGS Brain: There is no mass, hemorrhage or extra-axial collection. There is generalized atrophy without lobar predilection. There is hypoattenuation of the periventricular white matter, most commonly indicating chronic ischemic microangiopathy. Vascular: No abnormal hyperdensity of the major intracranial arteries or dural venous sinuses. No intracranial atherosclerosis. Skull: Small right frontal scalp hematoma.  No skull fracture. CT MAXILLOFACIAL FINDINGS Osseous: There are comminuted nasal bone fractures. There is a slightly displaced fracture of the right zygomatic arch. Orbits: The globes are intact. Normal appearance of the intra- and extraconal fat. Symmetric extraocular muscles and optic nerves. Sinuses: No fluid levels or advanced mucosal thickening. Soft tissues: Normal visualized extracranial soft tissues. CT CERVICAL SPINE FINDINGS Alignment: No static subluxation. Facets are aligned. Occipital condyles and the lateral masses of C1-C2 are aligned. Skull base and vertebrae: No acute fracture. Soft tissues and spinal canal: No prevertebral fluid or swelling. No visible canal hematoma. Disc levels: Disc bulges at C3-4 and C4-5 with mild spinal canal narrowing. Multilevel severe facet arthrosis. Upper chest: No pneumothorax, pulmonary nodule or pleural effusion. Other: Normal visualized paraspinal cervical soft tissues. IMPRESSION: 1. No acute intracranial abnormality. 2. Small right frontal scalp hematoma. 3. Comminuted nasal bone fractures and slightly displaced fracture of the right zygomatic arch. 4. No  acute fracture or static subluxation of the cervical spine. Electronically Signed   By: Deatra Robinson M.D.   On: 01/08/2020 01:33    Procedures .Marland KitchenLaceration Repair  Date/Time: 01/08/2020 2:06 AM Performed by: Dione Booze, MD Authorized by: Dione Booze, MD   Consent:    Consent obtained: Implied consent. Anesthesia (see MAR for exact dosages):    Anesthesia method:  None Laceration details:    Location:  Face   Face location:  Forehead   Length (cm):  6   Depth (mm):  2 Repair type:    Repair type:  Simple Pre-procedure details:    Preparation:  Patient was prepped and draped in usual sterile fashion and imaging obtained to evaluate for foreign bodies Exploration:    Hemostasis achieved with:  Direct pressure   Wound exploration: entire depth of wound probed and visualized     Wound extent: no foreign bodies/material noted     Contaminated: no   Treatment:    Area cleansed with:  Saline   Amount of cleaning:  Standard Skin repair:    Repair method:  Tissue adhesive Approximation:    Approximation:  Close Post-procedure details:    Dressing:  Open (no dressing)   Patient tolerance of procedure:  Tolerated well, no immediate complications .Marland KitchenLaceration Repair  Date/Time: 01/08/2020 2:07 AM Performed by: Dione Booze, MD Authorized by: Dione Booze, MD   Consent:    Consent obtained: Implied consent. Anesthesia (see MAR for exact dosages):    Anesthesia method:  None Laceration details:    Location:  Face  Facial location: Nose.   Length (cm):  1   Depth (mm):  2 Repair type:    Repair type:  Simple Pre-procedure details:    Preparation:  Patient was prepped and draped in usual sterile fashion and imaging obtained to evaluate for foreign bodies Exploration:    Hemostasis achieved with:  Direct pressure   Wound exploration: entire depth of wound probed and visualized     Wound extent: no foreign bodies/material noted     Contaminated: no   Treatment:    Area  cleansed with:  Saline   Amount of cleaning:  Standard Skin repair:    Repair method:  Tissue adhesive Approximation:    Approximation:  Close Post-procedure details:    Dressing:  Open (no dressing)   Patient tolerance of procedure:  Tolerated well, no immediate complications   CRITICAL CARE Performed by: Dione Booze Total critical care time: 40 minutes Critical care time was exclusive of separately billable procedures and treating other patients. Critical care was necessary to treat or prevent imminent or life-threatening deterioration. Critical care was time spent personally by me on the following activities: development of treatment plan with patient and/or surrogate as well as nursing, discussions with consultants, evaluation of patient's response to treatment, examination of patient, obtaining history from patient or surrogate, ordering and performing treatments and interventions, ordering and review of laboratory studies, ordering and review of radiographic studies, pulse oximetry and re-evaluation of patient's condition.  Medications Ordered in ED Medications  hydrALAZINE (APRESOLINE) tablet 25 mg (25 mg Oral Given 01/08/20 0118)    ED Course  I have reviewed the triage vital signs and the nursing notes.  Pertinent imaging results that were available during my care of the patient were reviewed by me and considered in my medical decision making (see chart for details).  MDM Rules/Calculators/A&P Unwitnessed fall with facial injuries.  His medication list is reviewed and he is not on any anticoagulants or antiplatelet agents.  He will be sent for CT scans of head, maxillofacial, cervical spine.  Old records are reviewed, and he has several prior ED visits for falls.  Last tetanus immunization was 2 months ago.  CT scans showed no intracranial injury, comminuted nasal fracture is confirmed.  Also, slightly depressed right zygoma fracture.  He will need to be referred to ENT for  follow-up.  Hydralazine was given because of markedly elevated blood pressure.  Lacerations are closed with tissue adhesive.  Blood pressure has come down to an acceptable level following hydralazine.  He does not carry a history of hypertension.  He will be returned to his skilled nursing facility with instructions to check blood pressure several times a day.  He may need to be on chronic antihypertensive medication.  He is referred to ENT for follow-up of his facial fractures.  Final Clinical Impression(s) / ED Diagnoses Final diagnoses:  Fall at nursing home, initial encounter  Laceration of forehead, initial encounter  Laceration of nose, initial encounter  Closed fracture of nasal bone, initial encounter  Closed fracture of right zygomatic arch, initial encounter (HCC)  Elevated blood-pressure reading without diagnosis of hypertension    Rx / DC Orders ED Discharge Orders    None       Dione Booze, MD 01/08/20 [phone]

## 2020-01-07 NOTE — ED Triage Notes (Signed)
BIB EMS from [facility]. Patient had unwitnessed fall, found on floor by staff. Patient presents with approx 2-3 in lac to forehead, 1-2cm lac to bridge of nose. Given Fentanyl en route. VSS. Oriented at baseline

## 2020-01-08 ENCOUNTER — Emergency Department (HOSPITAL_COMMUNITY): Payer: Medicare HMO

## 2020-01-08 ENCOUNTER — Telehealth: Payer: Self-pay | Admitting: Family Medicine

## 2020-01-08 DIAGNOSIS — M2550 Pain in unspecified joint: Secondary | ICD-10-CM | POA: Diagnosis not present

## 2020-01-08 DIAGNOSIS — S0003XA Contusion of scalp, initial encounter: Secondary | ICD-10-CM | POA: Diagnosis not present

## 2020-01-08 DIAGNOSIS — S022XXA Fracture of nasal bones, initial encounter for closed fracture: Secondary | ICD-10-CM | POA: Diagnosis not present

## 2020-01-08 DIAGNOSIS — F29 Unspecified psychosis not due to a substance or known physiological condition: Secondary | ICD-10-CM | POA: Diagnosis not present

## 2020-01-08 DIAGNOSIS — Z7401 Bed confinement status: Secondary | ICD-10-CM | POA: Diagnosis not present

## 2020-01-08 DIAGNOSIS — R4182 Altered mental status, unspecified: Secondary | ICD-10-CM | POA: Diagnosis not present

## 2020-01-08 DIAGNOSIS — I10 Essential (primary) hypertension: Secondary | ICD-10-CM | POA: Diagnosis not present

## 2020-01-08 DIAGNOSIS — S0240EA Zygomatic fracture, right side, initial encounter for closed fracture: Secondary | ICD-10-CM | POA: Diagnosis not present

## 2020-01-08 DIAGNOSIS — S199XXA Unspecified injury of neck, initial encounter: Secondary | ICD-10-CM | POA: Diagnosis not present

## 2020-01-08 MED ORDER — HYDRALAZINE HCL 25 MG PO TABS
25.0000 mg | ORAL_TABLET | Freq: Once | ORAL | Status: AC
  Administered 2020-01-08: 25 mg via ORAL
  Filled 2020-01-08: qty 1

## 2020-01-08 NOTE — Telephone Encounter (Signed)
Spoke with pt son Arlys John who is on pt DPR, state that he is waiting for pt to be accessed by rehabilitation Center in [place], advised to send or drop off an FL-2  to our office for Dr Salomon Fick to sign, Pt son verbalized understanding.

## 2020-01-08 NOTE — ED Notes (Signed)
[phone] sister Zakariya Knickerbocker would like an update on the pt

## 2020-01-08 NOTE — Discharge Instructions (Signed)
You have a broken bone in your nose and in the right cheek.  Please follow-up with the ENT physician in the next week.  Your blood pressure was very high today.  You did receive a blood pressure medication to bring it down.  You need to have your blood pressure checked several times a day for the next week.  If your blood pressure continues to stay high, you may need to be on medication to control your blood pressure.

## 2020-01-08 NOTE — ED Notes (Signed)
Patient transported to CT scan . 

## 2020-01-08 NOTE — Telephone Encounter (Signed)
Caryn Bee from [facility] needs you to call him about orders for patient.

## 2020-01-09 ENCOUNTER — Encounter: Payer: Self-pay | Admitting: Family Medicine

## 2020-01-09 ENCOUNTER — Ambulatory Visit (INDEPENDENT_AMBULATORY_CARE_PROVIDER_SITE_OTHER): Payer: Medicare HMO | Admitting: Family Medicine

## 2020-01-09 ENCOUNTER — Other Ambulatory Visit: Payer: Self-pay

## 2020-01-09 VITALS — BP 122/68 | HR 68 | Temp 98.7°F | Wt 117.0 lb

## 2020-01-09 DIAGNOSIS — Z20828 Contact with and (suspected) exposure to other viral communicable diseases: Secondary | ICD-10-CM | POA: Diagnosis not present

## 2020-01-09 DIAGNOSIS — F0280 Dementia in other diseases classified elsewhere without behavioral disturbance: Secondary | ICD-10-CM

## 2020-01-09 DIAGNOSIS — E440 Moderate protein-calorie malnutrition: Secondary | ICD-10-CM | POA: Diagnosis not present

## 2020-01-09 DIAGNOSIS — G20 Parkinson's disease: Secondary | ICD-10-CM

## 2020-01-09 DIAGNOSIS — Z1159 Encounter for screening for other viral diseases: Secondary | ICD-10-CM | POA: Diagnosis not present

## 2020-01-09 DIAGNOSIS — F339 Major depressive disorder, recurrent, unspecified: Secondary | ICD-10-CM

## 2020-01-09 NOTE — Telephone Encounter (Signed)
Spoke with Caryn Bee with [facility] this morning, advise that pt has an appointment this afternoon with Dr Salomon Fick and that she will review the forms withpt family. Pt family will contact [facility] after pt visit

## 2020-01-09 NOTE — Progress Notes (Signed)
Subjective:    Patient ID: William Cummings, male    DOB: 1940/09/16, 79 y.o.   MRN: [id number]  No chief complaint on file. Pt accompanied by his sister, [name] and his son, Arlys John.  HPI Pt is an 79 yo male with pmh sig for Parkinson's dz, h/o depression, and dementia who was seen today for f/u.  Pt's Parkinson dz progressing causing increased falls at nursing facility.  Pt in memory care unit, however needs more supervision.  Family having difficulty finding placement at other facilities given pt's needs.  Pt had a recent fall requiring ED visit for a laceration of forehead and bridge of nose 9/13.  Pt found to have comminuted nasal bone fx and slightly displaced fx of R zygomatic arch on CT with out contrast.  Sutures placed.    Pt states he is doing well.  Denies pain.  Past Medical History:  Diagnosis Date  . Dementia (HCC)   . Depression   . Parkinson's disease (HCC)     No Known Allergies  ROS Unable to perform 2/2 dementia.    Objective:    Blood pressure 122/68, pulse 68, temperature 98.7 F (37.1 C), temperature source Oral, weight 117 lb (53.1 kg), SpO2 98 %.   Gen. Pleasant, well-nourished, in no distress, normal affect, soft spoken, frail HEENT: /AT, face symmetric, conjunctiva clear, no scleral icterus, PERRLA, EOMI, nares patent without drainage Lungs: no accessory muscle use, CTAB, no wheezes or rales Cardiovascular: RRR, no m/r/g, no peripheral edema Abdomen: BS present, soft, NT/ND Musculoskeletal: Thenar eminence wasting, No deformities, no cyanosis or clubbing, normal tone Neuro:  A&Ox2, CN II-XII intact, normal gait Skin:  Warm, dry.  Healing laceration of forehead and bridge of nose with eschar and dried blood.    Wt Readings from Last 3 Encounters:  01/09/20 117 lb (53.1 kg)  01/07/20 139 lb 15.9 oz (63.5 kg)  11/08/19 140 lb (63.5 kg)    Lab Results  Component Value Date   WBC 4.3 10/15/2019   HGB 13.1 10/15/2019   HCT 40.7 10/15/2019   PLT  182 10/15/2019   GLUCOSE 89 10/15/2019   ALT 6 07/18/2019   AST 21 07/18/2019   NA 136 10/15/2019   K 4.5 10/15/2019   CL 102 10/15/2019   CREATININE 0.72 10/15/2019   BUN 19 10/15/2019   CO2 23 10/15/2019    Assessment/Plan:  Dementia associated with Parkinson's disease (HCC) -Progressing -Continue Sinemet CR 50-200 mg 1 tab nightly, Sinemet IR 25-100 mg 2 tabs at 8 AM, 1 at noon, 2 at 4 PM -Continue follow-up with neurology, Dr. Arbutus Leas -We will continue to work with family to find appropriate placement for pt  -will complete forms  Depression, recurrent (HCC) -stable -continue Zoloft 100 mg take 2 tabs daily.  Moderate protein-calorie malnutrition (HCC) -pt encouraged to eat snacks during the day. -consider supplemental drinks -consider albumin  F/u prn in the next month if less likely.  Abbe Amsterdam, MD

## 2020-01-09 NOTE — Patient Instructions (Signed)
Parkinson's Disease Parkinson's disease is a type of movement disorder. It is a long-term condition that gets worse over time (is progressive). Each person with Parkinson's disease is affected differently. This condition limits your ability to control movements and move your body normally. The condition can range from mild to severe. Parkinson's disease tends to get worse slowly over several years. What are the causes? Parkinson's disease results from a loss of brain cells (neurons) that make a brain chemical called dopamine. Dopamine is needed to control movement. As the condition gets worse, neurons make less dopamine. This makes it hard to move or control your movements. The exact cause of the loss of neurons and why they make less dopamine is not known. Factors related to genes and the environment may contribute to the cause of Parkinson's disease. What increases the risk? The following factors may make you more likely to develop this condition:  Being male.  Being age 3 or older.  Having a family history of Parkinson's disease.  Having had a traumatic brain injury.  Having experienced depression.  Having been exposed to toxins, such as pesticides. What are the signs or symptoms? Symptoms of this condition can vary. The main symptoms are related to movement. These include:  A tremor or shaking while you are resting that you cannot control.  Stiffness in your neck, arms, and legs (rigidity).  Slowing of movement. You may lose facial expressions and have trouble making small movements that are needed to button clothing or brush your teeth.  An abnormal walk. You may walk with short, shuffling steps.  Loss of balance and stability when standing. You may sway, fall backward, and have trouble making turns. Other symptoms include:  Mental or cognitive changes including depression, anxiety, having false beliefs (delusions), or seeing, hearing, or feeling things that do not exist  (hallucinations).  Trouble speaking or swallowing.  Changes in bowel or bladder functions including constipation, having to go urgently or frequently, or not being able to control your bowel or bladder.  Changes in sleep habits or trouble sleeping. Parkinson's disease may be graded by severity of your condition as mild, moderate, or advanced. Parkinson's disease progression is different for everyone. You may not progress to the advanced stage.  Mild Parkinson's disease involves: ? Movement problems that do not affect daily activities. ? Movement problems on one side of the body.  Moderate Parkinson's disease involves: ? Movement problems on both sides of the body. ? Slowing of movement. ? Coordination and balance problems.  Advanced Parkinson's disease involves: ? Extreme difficulty walking. ? Inability to live alone safely. ? Signs of dementia, such as having trouble remembering things, doing daily tasks such as getting dressed, and problem solving. How is this diagnosed? This condition is diagnosed by a specialist. A diagnosis may be made based on symptoms, your medical history, and a physical exam. You may also have brain imaging tests to check for a loss of dopamine-producing areas of the brain. How is this treated? There is no cure for Parkinson's disease. Treatment focuses on managing your symptoms. Treatment may include:  Medicines. Everyone responds to medicines differently. Your response may change over time. Work with your health care provider to find the best medicines for you.  Speech, occupational, and physical therapy.  Deep brain stimulation surgery to reduce tremors and other involuntary movements. Follow these instructions at home: Medicines  Take over-the-counter and prescription medicines only as told by your health care provider.  Avoid taking medicines that can affect  thinking, such as pain or sleeping medicines. Eating and drinking  Follow instructions  from your health care provider about eating or drinking restrictions.  Do not drink alcohol. Activity  Talk with your health care provider about if it is safe for you to drive.  Do exercises as told by your health care provider or physical therapist. Lifestyle      Install grab bars and railings in your home to prevent falls.  Do not use any products that contain nicotine or tobacco, such as cigarettes, e-cigarettes, and chewing tobacco. If you need help quitting, ask your health care provider.  Consider joining a support group for people with Parkinson's disease. General instructions  Work with your health care provider to determine what you need help with and what your safety needs are.  Keep all follow-up visits as told by your health care provider, including any visits with a physical therapist, speech therapist, or occupational therapist. This is important. Contact a health care provider if:  Medicines do not help your symptoms.  You are unsteady or have fallen at home.  You need more support to function well at home.  You have trouble swallowing.  You have severe constipation.  You are having problems with side effects from your medicines.  You feel confused, anxious, or depressed. Get help right away if you:  Are injured after a fall.  See or hear things that are not real.  Cannot swallow without choking.  Have chest pain or trouble breathing.  Do not feel safe at home.  Have thoughts about hurting yourself or others. If you ever feel like you may hurt yourself or others, or have thoughts about taking your own life, get help right away. You can go to your nearest emergency department or call:  Your local emergency services (911 in the [place]  A suicide crisis helpline, such as the National Suicide Prevention Lifeline at (717)446-0156. This is open 24 hours a day. Summary  Parkinson's disease is a long-term condition that gets worse over time. This  condition limits your ability to control your movements and move your body normally.  There is no cure for Parkinson's disease. Treatment focuses on managing your symptoms.  Work with your health care provider to determine what you need help with and what your safety needs are.  Keep all follow-up visits as told by your health care provider, including any visits with a physical therapist, speech therapist, or occupational therapist. This is important. This information is not intended to replace advice given to you by your health care provider. Make sure you discuss any questions you have with your health care provider. Document Revised: 06/29/2018 Document Reviewed: 06/29/2018 Elsevier Patient Education  2020 [employer]  Protein-Energy Malnutrition Protein-energy malnutrition is when a person does not eat enough protein, fat, and calories. When this happens over time, it can lead to severe loss of muscle tissue (muscle wasting). This condition also affects the body's defense system (immune system) and can lead to other health problems. What are the causes? This condition may be caused by:  Not eating enough protein, fat, or calories.  Having certain chronic medical conditions.  Eating too little. What increases the risk? The following factors may make you more likely to develop this condition:  Living in poverty.  Long-term hospitalization.  Alcohol or drug dependency. Addiction often leads to a lifestyle in which proper diet is ignored. Dependency can also hurt the metabolism and the body's ability to absorb nutrients.  Eating disorders,  such as anorexia nervosa or bulimia.  Chewing or swallowing problems. People with these disorders may not eat enough.  Having certain conditions, such as: ? Inflammatory bowel disease. Inflammation of the intestines makes it difficult for the body to absorb nutrients. ? Cancer or AIDS. These diseases can cause a loss of appetite. ? Chronic  heart failure. This interferes with how the body uses nutrients. ? Cystic fibrosis. This disease can make it difficult for the body to absorb nutrients.  Eating a diet that extremely restricts protein, fat, or calorie intake. What are the signs or symptoms? Symptoms of this condition include:  Fatigue.  Weakness.  Dizziness.  Fainting.  Weight loss.  Loss of muscle tone and muscle mass.  Poor immune response.  Lack of menstruation.  Poor memory.  Hair loss.  Skin changes. How is this diagnosed? This condition may be diagnosed based on:  Your medical and dietary history.  A physical exam. This may include a measurement of your body mass index (BMI).  Blood tests. How is this treated? This condition may be managed with:  Nutrition therapy. This may include working with a diet and nutrition specialist (dietitian).  Treatment for underlying conditions. People with severe protein-energy malnutrition may need to be treated in a hospital. This may involve receiving nutrition and fluids through an IV. Follow these instructions at home:   Eat a balanced diet. In each meal, include at least one food that is high in protein. Foods that are high in protein include: ? Meat. ? Poultry. ? Fish. ? Eggs. ? Cheese. ? Milk. ? Beans. ? Nuts.  Eat nutrient-rich foods that are easy to swallow and digest, such as: ? Fruit and yogurt smoothies. ? Oatmeal with nut butter.  Try to eat six small meals each day instead of three large meals.  Take vitamin and protein supplements as told by your health care provider or dietitian.  Follow your health care provider's recommendations about exercise and activity.  Keep all follow-up visits as told by your health care provider. This is important. Contact a health care provider if you:  Have increased weakness or fatigue.  Faint.  Are a woman and you stop having your period (menstruating).  Have rapid hair loss.  Have  unexpected weight loss.  Have diarrhea.  Have nausea and vomiting. Get help right away if you have:  Difficulty breathing.  Chest pain. Summary  Protein-energy malnutrition is when a person does not eat enough protein, fat, and calories.  Protein-energy malnutrition can lead to severe loss of muscle tissue (muscle wasting). This condition also affects the body's defense system (immune system) and can lead to other health problems.  Talk with your health care provider about treatment for this condition. Effective treatment depends on the underlying cause of the malnutrition. This information is not intended to replace advice given to you by your health care provider. Make sure you discuss any questions you have with your health care provider. Document Revised: 04/27/2017 Document Reviewed: 04/27/2017 Elsevier Patient Education  2020 [employer].  Dementia Dementia is a condition that affects the way the brain works. It often affects memory and thinking. There are many types of dementia. Some types get worse with time and cannot be reversed. Some types of dementia include:  Alzheimer's disease. This is the most common type.  Vascular dementia. This type may happen due to a stroke.  Lewy body dementia. This type may happen to people who have Parkinson's disease.  Frontotemporal dementia. This type  is caused by damage to nerve cells in certain parts of the brain. Some people may have more than one type, and this is called mixed dementia. What are the causes? This condition is caused by damage to cells in the brain. Some causes that cannot be reversed include:  Having a condition that affects the blood vessels of the brain, such as diabetes, heart disease, or blood vessel disease.  Changes to genes. Some causes that can be reversed or slowed include:  Injury to the brain.  Certain medicines.  Infection.  Not having enough vitamin B12 in the body, or thyroid problems.  A  tumor or blood clot in the brain. What are the signs or symptoms? Symptoms depend on the type of dementia. This may include:  Problems remembering things.  Having trouble taking a bath or putting clothes on.  Forgetting appointments.  Forgetting to pay bills.  Trouble planning and making meals.  Having trouble speaking.  Getting lost easily. How is this treated? Treatment depends on the cause of the dementia. It might include taking medicines that help:  To control the dementia.  To slow down the dementia.  To manage symptoms. In some cases, treating the cause of your dementia can improve symptoms, reverse symptoms, or slow down how quickly it gets worse. Your doctor can help you find support groups and other doctors who can help with your care. Follow these instructions at home: Medicines  Take over-the-counter and prescription medicines only as told by your doctor.  Use a pill organizer to help you manage your medicines.  Avoidtaking medicines for pain or for sleep. Lifestyle  Make healthy choices: ? Be active as told by your doctor. ? Do not use any products that contain nicotine or tobacco, such as cigarettes, e-cigarettes, and chewing tobacco. If you need help quitting, ask your doctor. ? Do not drink alcohol. ? When you get stressed, do something that will help you to relax. Your doctor can give you tips. ? Spend time with other people.  Make sure you get good sleep. To get good sleep: ? Try not to take naps during the day. ? Keep your bedroom dark and cool. ? In the few hours before you go to bed, try not to do any exercise. ? Do not have foods and drinks with caffeine at night. Eating and drinking  Drink enough fluid to keep your pee (urine) pale yellow.  Eat a healthy diet. General instructions   Talk with your doctor to figure out: ? What you need help with. ? What your safety needs are.  Ask your doctor if it is safe for you to drive.  If told,  wear a bracelet that tracks where you are or shows that you are a person with memory loss.  Work with your family to make big decisions.  Keep all follow-up visits as told by your doctor. This is important. Contact a doctor if:  You have any new symptoms.  Your symptoms get worse.  You have problems with swallowing or choking. Get help right away if:  You feel very sad, or feel that you want to harm yourself.  You or your family members are worried for your safety. If you ever feel like you may hurt yourself or others, or have thoughts about taking your own life, get help right away. You can go to your nearest emergency department or call:  Your local emergency services (911 in the [place]  A suicide crisis helpline, such as the  National Suicide Prevention Lifeline at 5674744373. This is open 24 hours a day. Summary  Dementia often affects memory and thinking.  Some types of dementia get worse with time and cannot be reversed.  Treatment for this condition depends on the cause.  Talk with your doctor to figure out what you need help with.  Your doctor can help you find support groups and other doctors who can help with your care. This information is not intended to replace advice given to you by your health care provider. Make sure you discuss any questions you have with your health care provider. Document Revised: 06/27/2018 Document Reviewed: 06/27/2018 Elsevier Patient Education  2020 [employer].

## 2020-01-10 ENCOUNTER — Telehealth: Payer: Self-pay | Admitting: Family Medicine

## 2020-01-10 NOTE — Telephone Encounter (Signed)
[facility] called to say they found a nursing home for his father and nothing further is needed.Marland KitchenMarland KitchenMarland Kitchen

## 2020-01-10 NOTE — Telephone Encounter (Signed)
FYI. Noted

## 2020-01-14 DIAGNOSIS — F0280 Dementia in other diseases classified elsewhere without behavioral disturbance: Secondary | ICD-10-CM | POA: Diagnosis not present

## 2020-01-14 DIAGNOSIS — R41841 Cognitive communication deficit: Secondary | ICD-10-CM | POA: Diagnosis not present

## 2020-01-14 DIAGNOSIS — R2681 Unsteadiness on feet: Secondary | ICD-10-CM | POA: Diagnosis not present

## 2020-01-14 DIAGNOSIS — R262 Difficulty in walking, not elsewhere classified: Secondary | ICD-10-CM | POA: Diagnosis not present

## 2020-01-14 DIAGNOSIS — R269 Unspecified abnormalities of gait and mobility: Secondary | ICD-10-CM | POA: Diagnosis not present

## 2020-01-14 DIAGNOSIS — F0390 Unspecified dementia without behavioral disturbance: Secondary | ICD-10-CM | POA: Diagnosis not present

## 2020-01-14 DIAGNOSIS — Z8659 Personal history of other mental and behavioral disorders: Secondary | ICD-10-CM | POA: Diagnosis not present

## 2020-01-14 DIAGNOSIS — R278 Other lack of coordination: Secondary | ICD-10-CM | POA: Diagnosis not present

## 2020-01-14 DIAGNOSIS — R2689 Other abnormalities of gait and mobility: Secondary | ICD-10-CM | POA: Diagnosis not present

## 2020-01-14 DIAGNOSIS — R1312 Dysphagia, oropharyngeal phase: Secondary | ICD-10-CM | POA: Diagnosis not present

## 2020-01-14 DIAGNOSIS — R531 Weakness: Secondary | ICD-10-CM | POA: Diagnosis not present

## 2020-01-14 DIAGNOSIS — R54 Age-related physical debility: Secondary | ICD-10-CM | POA: Diagnosis not present

## 2020-01-14 DIAGNOSIS — F339 Major depressive disorder, recurrent, unspecified: Secondary | ICD-10-CM | POA: Diagnosis not present

## 2020-01-14 DIAGNOSIS — G20 Parkinson's disease: Secondary | ICD-10-CM | POA: Diagnosis not present

## 2020-01-15 DIAGNOSIS — G20 Parkinson's disease: Secondary | ICD-10-CM | POA: Diagnosis not present

## 2020-01-15 DIAGNOSIS — R531 Weakness: Secondary | ICD-10-CM | POA: Diagnosis not present

## 2020-01-15 DIAGNOSIS — F0280 Dementia in other diseases classified elsewhere without behavioral disturbance: Secondary | ICD-10-CM | POA: Diagnosis not present

## 2020-01-22 ENCOUNTER — Encounter: Payer: Self-pay | Admitting: Family Medicine

## 2020-01-22 DIAGNOSIS — Z8659 Personal history of other mental and behavioral disorders: Secondary | ICD-10-CM | POA: Diagnosis not present

## 2020-01-22 DIAGNOSIS — R54 Age-related physical debility: Secondary | ICD-10-CM | POA: Diagnosis not present

## 2020-01-22 DIAGNOSIS — E440 Moderate protein-calorie malnutrition: Secondary | ICD-10-CM | POA: Insufficient documentation

## 2020-01-22 DIAGNOSIS — G20 Parkinson's disease: Secondary | ICD-10-CM | POA: Diagnosis not present

## 2020-01-22 DIAGNOSIS — R262 Difficulty in walking, not elsewhere classified: Secondary | ICD-10-CM | POA: Diagnosis not present

## 2020-04-26 DEATH — deceased

## 2020-05-16 ENCOUNTER — Ambulatory Visit: Payer: Medicare HMO | Admitting: Neurology

## 2021-07-03 IMAGING — CR DG PELVIS 1-2V
1 series · 1 of 1 positions shown · non-contrast
Comparison: None.

CLINICAL DATA: Acute pelvic pain following fall. Initial encounter.

EXAM:
PELVIS - 1-2 VIEW

[x pelvis]
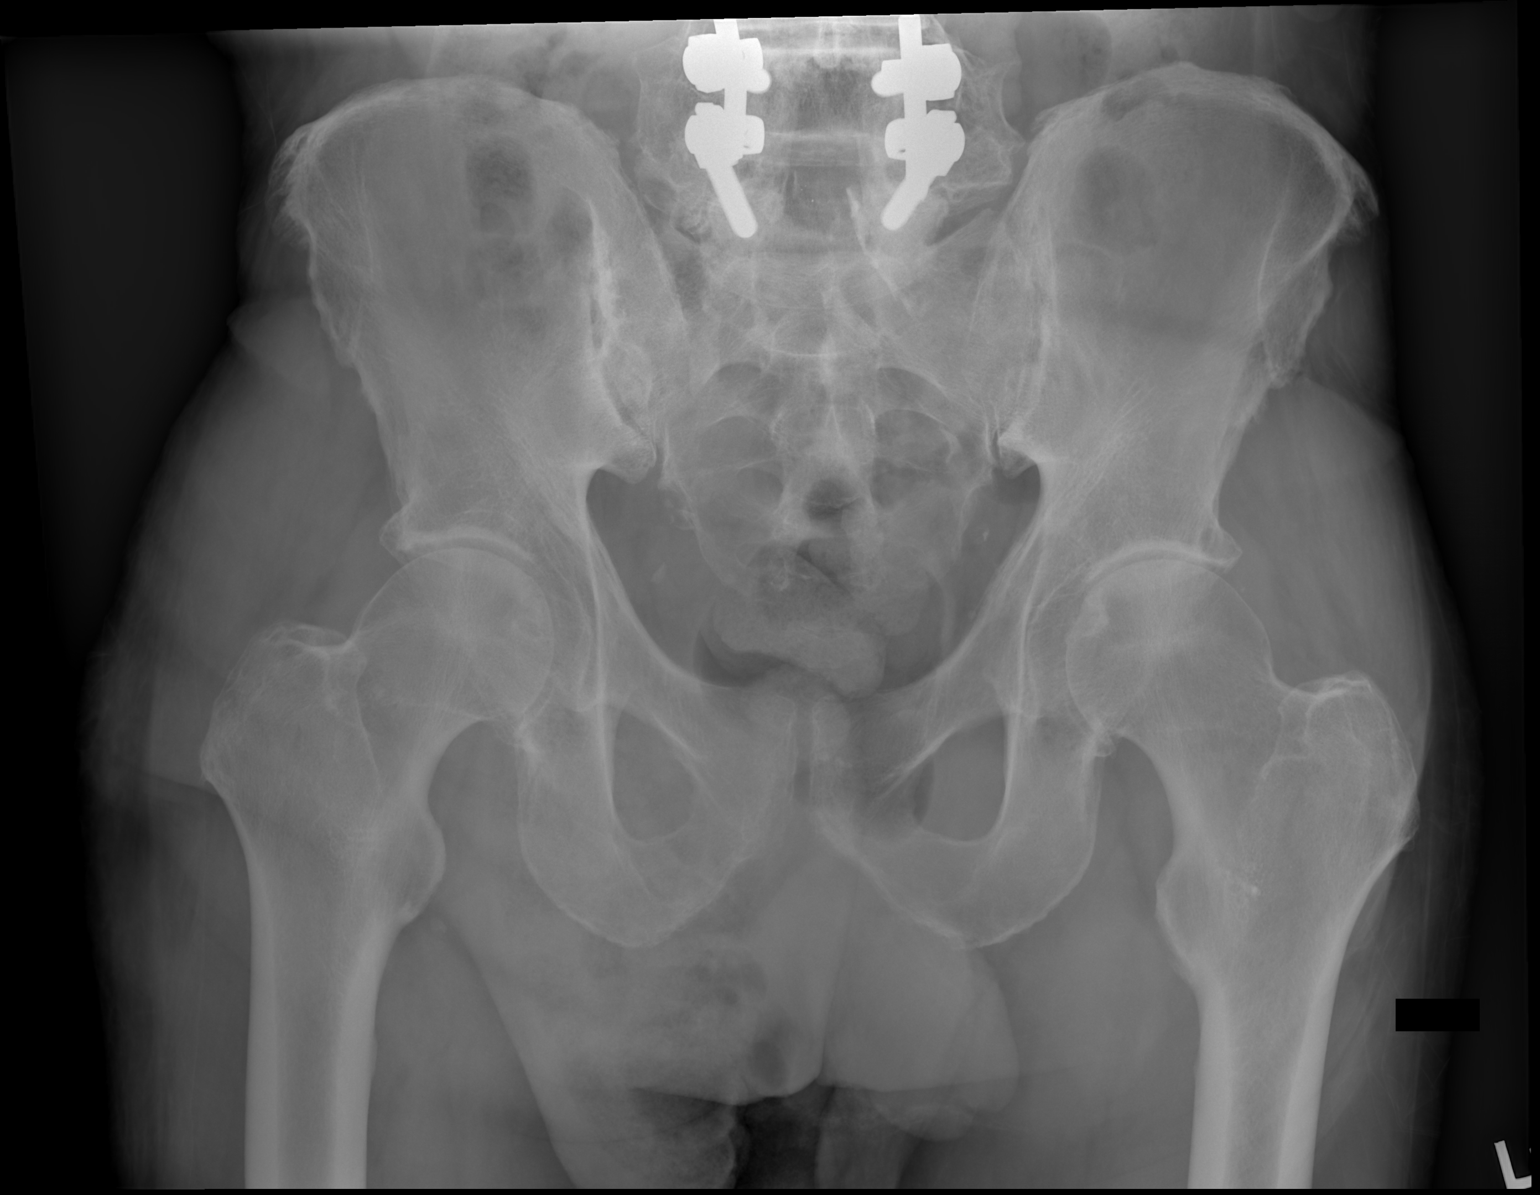

[1 of 1 positions shown; findings below may reference images not displayed]

FINDINGS: There is no evidence of pelvic fracture or diastasis. No pelvic bone
lesions are seen.

Surgical hardware in the LOWER lumbar spine identified.
IMPRESSION: No acute bony abnormality.

## 2021-07-03 IMAGING — CR DG TIBIA/FIBULA 2V*R*
4 series · 4 of 4 positions shown · non-contrast
Comparison: None.

CLINICAL DATA: Right leg pain, fall

EXAM:
RIGHT TIBIA AND FIBULA - 2 VIEW

[x tib-fib ap right (1 of 2)]
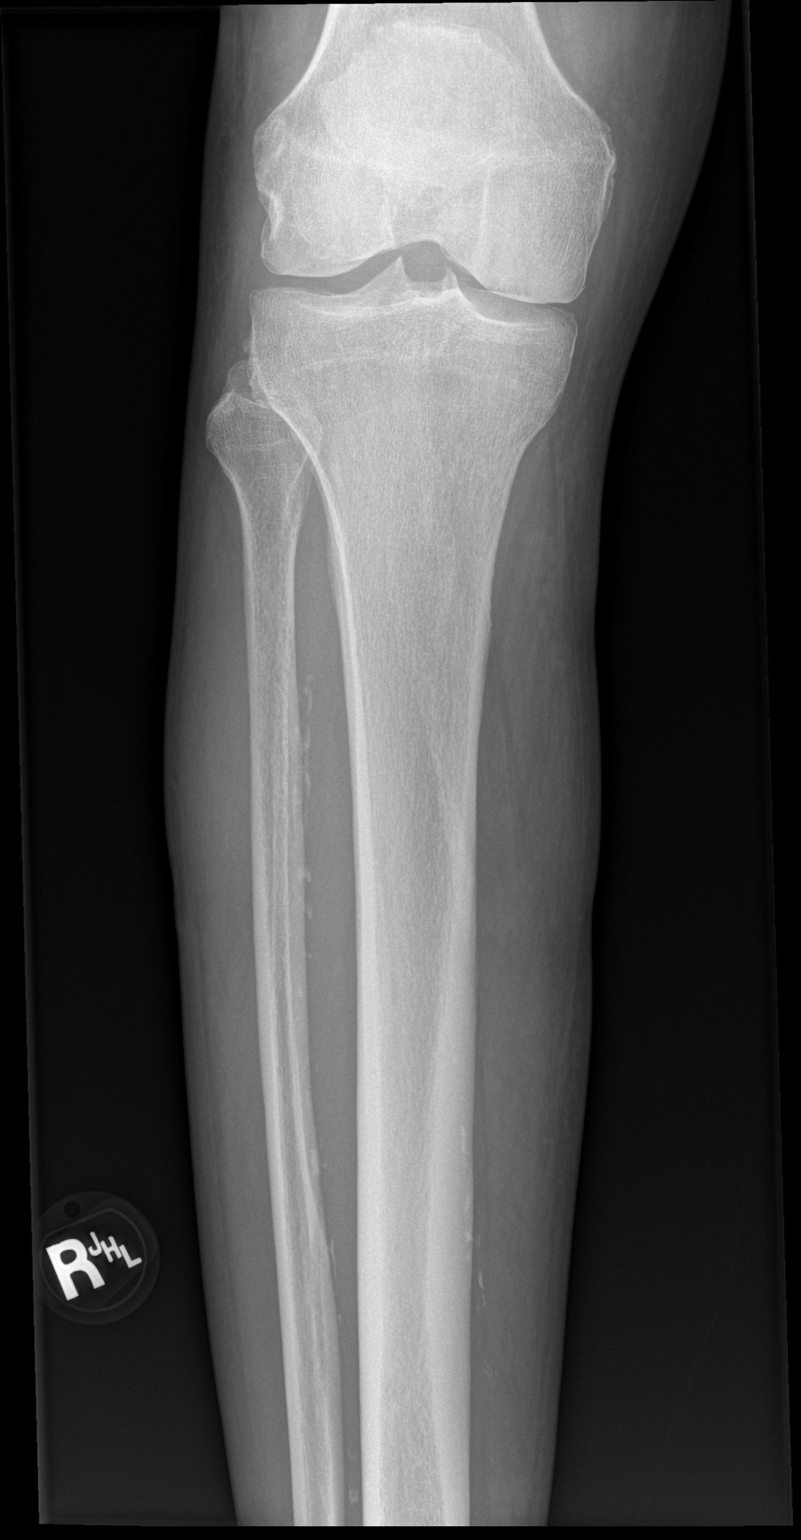

[x tib-fib ap right (2 of 2)]
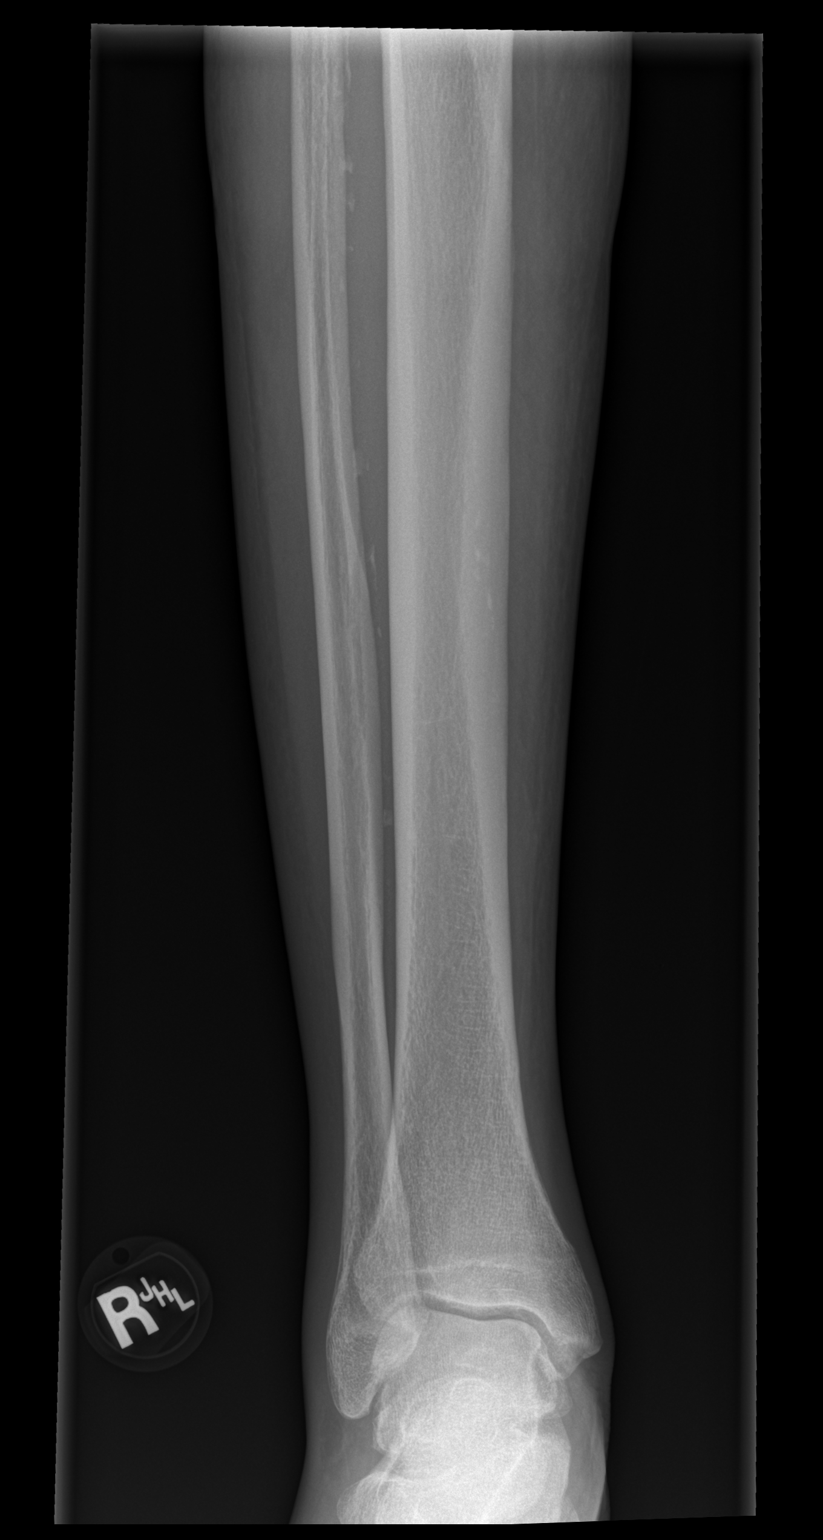

[x tib-fib lat right (1 of 2)]
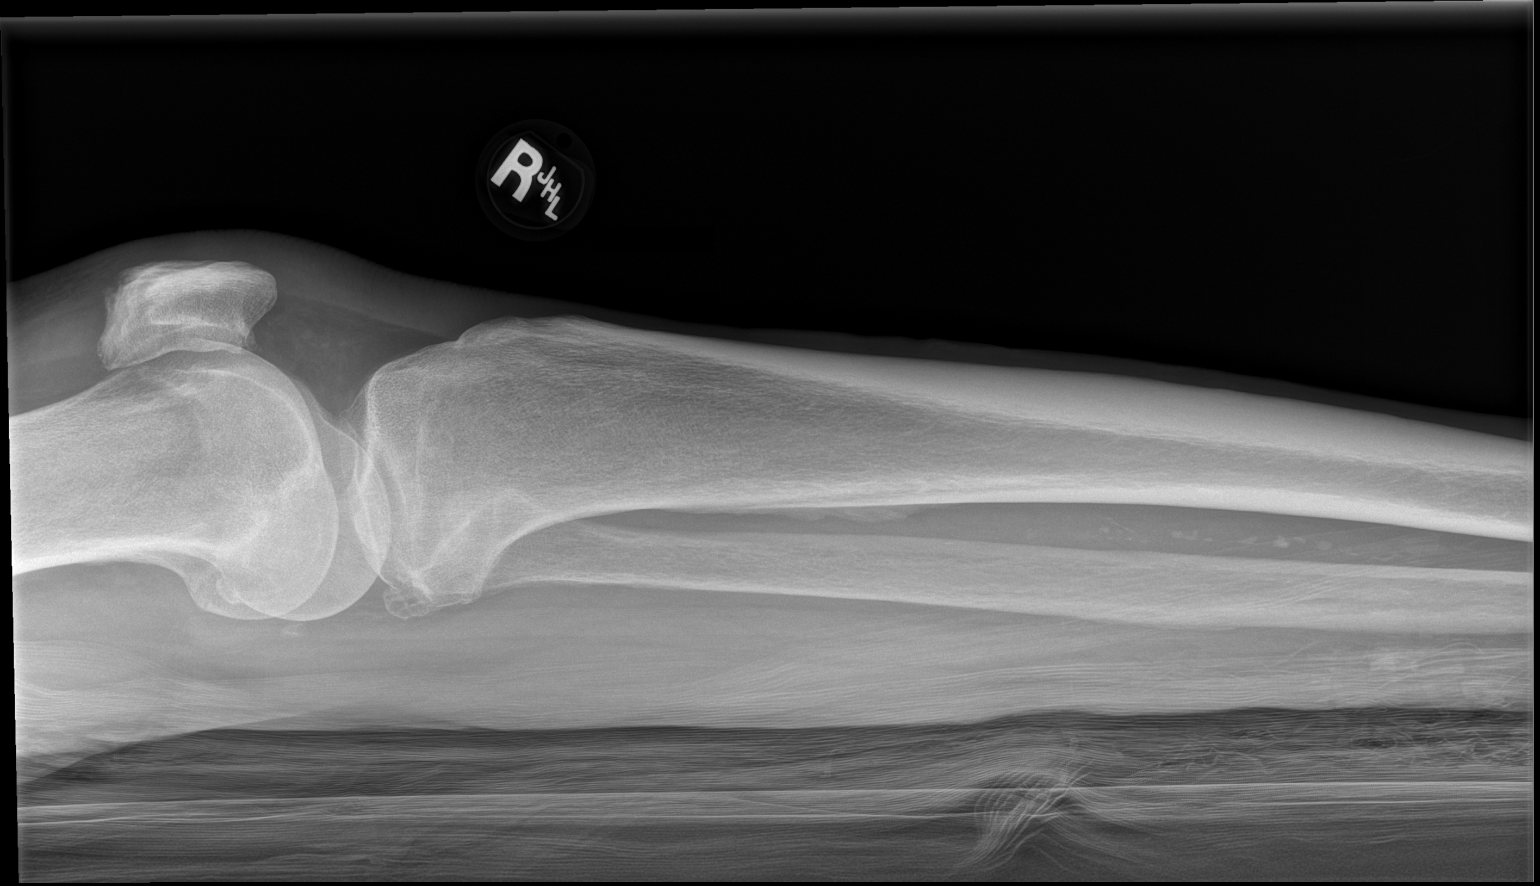

[x tib-fib lat right (2 of 2)]
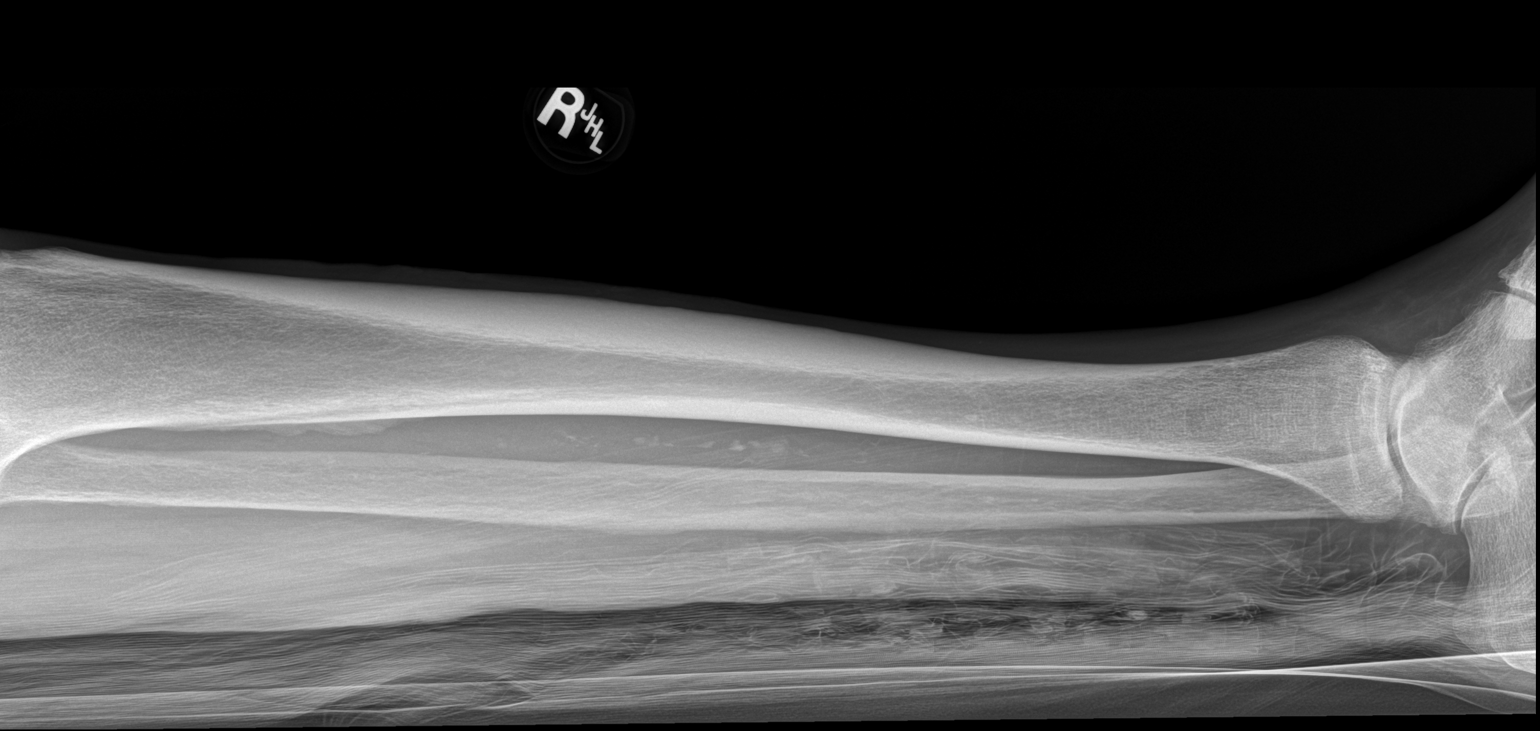

[4 of 4 positions shown; findings below may reference images not displayed]

FINDINGS: There is no evidence of fracture or other focal bone lesions.
Alignment at the knee and ankle are maintained. Scattered vascular
calcifications. Soft tissues are otherwise unremarkable.
IMPRESSION: Negative.
